# Patient Record
Sex: Female | Born: 1970 | Race: White | Hispanic: No | Marital: Married | Smoking: Current every day smoker
Health system: Southern US, Community
[De-identification: ages and names within clinical notes are randomized; demographics above are authoritative.]

---

## 1997-12-22 ENCOUNTER — Encounter: Payer: Self-pay | Admitting: Obstetrics and Gynecology

## 1997-12-22 ENCOUNTER — Ambulatory Visit (HOSPITAL_COMMUNITY): Admission: RE | Admit: 1997-12-22 | Discharge: 1997-12-22 | Payer: Self-pay | Admitting: Obstetrics and Gynecology

## 1998-06-01 ENCOUNTER — Ambulatory Visit (HOSPITAL_COMMUNITY): Admission: RE | Admit: 1998-06-01 | Discharge: 1998-06-01 | Payer: Self-pay | Admitting: Obstetrics and Gynecology

## 1999-02-12 ENCOUNTER — Ambulatory Visit (HOSPITAL_COMMUNITY): Admission: RE | Admit: 1999-02-12 | Discharge: 1999-02-12 | Payer: Self-pay | Admitting: Obstetrics and Gynecology

## 1999-02-12 ENCOUNTER — Encounter (INDEPENDENT_AMBULATORY_CARE_PROVIDER_SITE_OTHER): Payer: Self-pay

## 1999-04-03 ENCOUNTER — Emergency Department (HOSPITAL_COMMUNITY): Admission: EM | Admit: 1999-04-03 | Discharge: 1999-04-03 | Payer: Self-pay | Admitting: Emergency Medicine

## 1999-09-08 ENCOUNTER — Other Ambulatory Visit: Admission: RE | Admit: 1999-09-08 | Discharge: 1999-09-08 | Payer: Self-pay | Admitting: Obstetrics and Gynecology

## 2000-03-01 ENCOUNTER — Inpatient Hospital Stay (HOSPITAL_COMMUNITY): Admission: AD | Admit: 2000-03-01 | Discharge: 2000-03-01 | Payer: Self-pay | Admitting: Obstetrics and Gynecology

## 2000-03-30 ENCOUNTER — Inpatient Hospital Stay (HOSPITAL_COMMUNITY): Admission: AD | Admit: 2000-03-30 | Discharge: 2000-04-02 | Payer: Self-pay | Admitting: *Deleted

## 2000-04-03 ENCOUNTER — Inpatient Hospital Stay (HOSPITAL_COMMUNITY): Admission: AD | Admit: 2000-04-03 | Discharge: 2000-04-05 | Payer: Self-pay | Admitting: Obstetrics and Gynecology

## 2000-05-15 ENCOUNTER — Other Ambulatory Visit: Admission: RE | Admit: 2000-05-15 | Discharge: 2000-05-15 | Payer: Self-pay | Admitting: Obstetrics and Gynecology

## 2000-08-29 ENCOUNTER — Other Ambulatory Visit: Admission: RE | Admit: 2000-08-29 | Discharge: 2000-08-29 | Payer: Self-pay | Admitting: Obstetrics and Gynecology

## 2001-06-11 ENCOUNTER — Other Ambulatory Visit: Admission: RE | Admit: 2001-06-11 | Discharge: 2001-06-11 | Payer: Self-pay | Admitting: Obstetrics and Gynecology

## 2001-12-28 ENCOUNTER — Inpatient Hospital Stay (HOSPITAL_COMMUNITY): Admission: AD | Admit: 2001-12-28 | Discharge: 2001-12-30 | Payer: Self-pay | Admitting: Obstetrics and Gynecology

## 2002-02-17 ENCOUNTER — Other Ambulatory Visit: Admission: RE | Admit: 2002-02-17 | Discharge: 2002-02-17 | Payer: Self-pay | Admitting: Obstetrics and Gynecology

## 2003-02-16 ENCOUNTER — Other Ambulatory Visit (HOSPITAL_COMMUNITY): Admission: RE | Admit: 2003-02-16 | Discharge: 2003-02-19 | Payer: Self-pay | Admitting: Psychiatry

## 2003-05-11 ENCOUNTER — Emergency Department (HOSPITAL_COMMUNITY): Admission: EM | Admit: 2003-05-11 | Discharge: 2003-05-12 | Payer: Self-pay | Admitting: Emergency Medicine

## 2003-08-12 ENCOUNTER — Emergency Department (HOSPITAL_COMMUNITY): Admission: EM | Admit: 2003-08-12 | Discharge: 2003-08-13 | Payer: Self-pay

## 2003-08-13 ENCOUNTER — Inpatient Hospital Stay (HOSPITAL_COMMUNITY): Admission: EM | Admit: 2003-08-13 | Discharge: 2003-08-15 | Payer: Self-pay | Admitting: Psychiatry

## 2005-10-13 ENCOUNTER — Ambulatory Visit: Payer: Self-pay | Admitting: Internal Medicine

## 2006-07-18 ENCOUNTER — Ambulatory Visit: Payer: Self-pay | Admitting: Internal Medicine

## 2006-07-18 LAB — CONVERTED CEMR LAB
ALT: 18 units/L (ref 0–40)
AST: 18 units/L (ref 0–37)
Albumin: 3.8 g/dL (ref 3.5–5.2)
Alkaline Phosphatase: 56 units/L (ref 39–117)
BUN: 8 mg/dL (ref 6–23)
Basophils Absolute: 0.1 10*3/uL (ref 0.0–0.1)
Basophils Relative: 0.6 % (ref 0.0–1.0)
Bilirubin Urine: NEGATIVE
Bilirubin, Direct: 0.1 mg/dL (ref 0.0–0.3)
CO2: 31 meq/L (ref 19–32)
Calcium: 9.4 mg/dL (ref 8.4–10.5)
Chloride: 101 meq/L (ref 96–112)
Cholesterol: 173 mg/dL (ref 0–200)
Creatinine, Ser: 0.8 mg/dL (ref 0.4–1.2)
Eosinophils Absolute: 0.3 10*3/uL (ref 0.0–0.6)
Eosinophils Relative: 3 % (ref 0.0–5.0)
GFR calc Af Amer: 104 mL/min
GFR calc non Af Amer: 86 mL/min
Glucose, Bld: 79 mg/dL (ref 70–99)
HCT: 41.9 % (ref 36.0–46.0)
HDL: 52.5 mg/dL (ref >39.0)
Hemoglobin, Urine: NEGATIVE
Hemoglobin: 14.5 g/dL (ref 12.0–15.0)
Ketones, ur: NEGATIVE mg/dL
LDL Cholesterol: 106 mg/dL — ABNORMAL HIGH (ref 0–99)
Leukocytes, UA: NEGATIVE
Lymphocytes Relative: 26.7 % (ref 12.0–46.0)
MCHC: 34.5 g/dL (ref 30.0–36.0)
MCV: 95.9 fL (ref 78.0–100.0)
Monocytes Absolute: 0.6 10*3/uL (ref 0.2–0.7)
Monocytes Relative: 6.4 % (ref 3.0–11.0)
Neutro Abs: 5.3 10*3/uL (ref 1.4–7.7)
Neutrophils Relative %: 63.3 % (ref 43.0–77.0)
Nitrite: NEGATIVE
Platelets: 316 10*3/uL (ref 150–400)
Potassium: 4.6 meq/L (ref 3.5–5.1)
RBC: 4.37 M/uL (ref 3.87–5.11)
RDW: 12.2 % (ref 11.5–14.6)
Sodium: 136 meq/L (ref 135–145)
Specific Gravity, Urine: 1.01 (ref 1.000–1.03)
TSH: 2.58 microintl units/mL (ref 0.35–5.50)
Total Bilirubin: 0.7 mg/dL (ref 0.3–1.2)
Total CHOL/HDL Ratio: 3.3
Total Protein, Urine: NEGATIVE mg/dL
Total Protein: 6.7 g/dL (ref 6.0–8.3)
Triglycerides: 75 mg/dL (ref 0–149)
Urine Glucose: NEGATIVE mg/dL
Urobilinogen, UA: 0.2 (ref 0.0–1.0)
VLDL: 15 mg/dL (ref 0–40)
WBC: 8.6 10*3/uL (ref 4.5–10.5)
pH: 7 (ref 5.0–8.0)

## 2006-07-19 ENCOUNTER — Encounter: Payer: Self-pay | Admitting: Internal Medicine

## 2006-07-19 LAB — CONVERTED CEMR LAB: Valproic Acid Lvl: 70.7 ug/mL (ref 50.0–100.0)

## 2006-10-24 ENCOUNTER — Encounter: Payer: Self-pay | Admitting: Internal Medicine

## 2006-10-24 DIAGNOSIS — Z9189 Other specified personal risk factors, not elsewhere classified: Secondary | ICD-10-CM | POA: Insufficient documentation

## 2006-10-24 DIAGNOSIS — F319 Bipolar disorder, unspecified: Secondary | ICD-10-CM | POA: Insufficient documentation

## 2006-10-24 DIAGNOSIS — E039 Hypothyroidism, unspecified: Secondary | ICD-10-CM

## 2006-10-24 DIAGNOSIS — F1021 Alcohol dependence, in remission: Secondary | ICD-10-CM | POA: Insufficient documentation

## 2006-10-24 DIAGNOSIS — F1011 Alcohol abuse, in remission: Secondary | ICD-10-CM | POA: Insufficient documentation

## 2006-10-24 DIAGNOSIS — F309 Manic episode, unspecified: Secondary | ICD-10-CM | POA: Insufficient documentation

## 2006-10-24 DIAGNOSIS — F1111 Opioid abuse, in remission: Secondary | ICD-10-CM | POA: Insufficient documentation

## 2006-10-24 DIAGNOSIS — J309 Allergic rhinitis, unspecified: Secondary | ICD-10-CM | POA: Insufficient documentation

## 2007-06-15 ENCOUNTER — Emergency Department (HOSPITAL_COMMUNITY): Admission: EM | Admit: 2007-06-15 | Discharge: 2007-06-15 | Payer: Self-pay | Admitting: Emergency Medicine

## 2007-07-29 ENCOUNTER — Encounter: Payer: Self-pay | Admitting: Internal Medicine

## 2007-08-05 ENCOUNTER — Telehealth: Payer: Self-pay | Admitting: Internal Medicine

## 2007-11-14 ENCOUNTER — Ambulatory Visit: Payer: Self-pay | Admitting: Internal Medicine

## 2007-11-18 ENCOUNTER — Encounter: Payer: Self-pay | Admitting: Internal Medicine

## 2007-11-18 LAB — CONVERTED CEMR LAB
ALT: 23 units/L (ref 0–35)
AST: 19 units/L (ref 0–37)
Albumin: 4.1 g/dL (ref 3.5–5.2)
Alkaline Phosphatase: 39 units/L (ref 39–117)
BUN: 8 mg/dL (ref 6–23)
Basophils Absolute: 0 10*3/uL (ref 0.0–0.1)
Basophils Relative: 0 % (ref 0.0–3.0)
Bilirubin, Direct: 0.2 mg/dL (ref 0.0–0.3)
CO2: 29 meq/L (ref 19–32)
Calcium: 9.5 mg/dL (ref 8.4–10.5)
Chloride: 102 meq/L (ref 96–112)
Creatinine, Ser: 0.9 mg/dL (ref 0.4–1.2)
Eosinophils Absolute: 0.2 10*3/uL (ref 0.0–0.7)
Eosinophils Relative: 2.7 % (ref 0.0–5.0)
GFR calc Af Amer: 91 mL/min
GFR calc non Af Amer: 75 mL/min
Glucose, Bld: 99 mg/dL (ref 70–99)
HCT: 41.3 % (ref 36.0–46.0)
Hemoglobin: 14.4 g/dL (ref 12.0–15.0)
Lymphocytes Relative: 27.3 % (ref 12.0–46.0)
MCHC: 34.8 g/dL (ref 30.0–36.0)
MCV: 98 fL (ref 78.0–100.0)
Monocytes Absolute: 0.5 10*3/uL (ref 0.1–1.0)
Monocytes Relative: 6.4 % (ref 3.0–12.0)
Neutro Abs: 4.6 10*3/uL (ref 1.4–7.7)
Neutrophils Relative %: 63.6 % (ref 43.0–77.0)
Platelets: 250 10*3/uL (ref 150–400)
Potassium: 4.7 meq/L (ref 3.5–5.1)
RBC: 4.22 M/uL (ref 3.87–5.11)
RDW: 12.2 % (ref 11.5–14.6)
Sodium: 137 meq/L (ref 135–145)
TSH: 1.76 microintl units/mL (ref 0.35–5.50)
Total Bilirubin: 0.8 mg/dL (ref 0.3–1.2)
Total Protein: 6.7 g/dL (ref 6.0–8.3)
WBC: 7.3 10*3/uL (ref 4.5–10.5)

## 2008-01-29 ENCOUNTER — Telehealth: Payer: Self-pay | Admitting: Internal Medicine

## 2008-02-04 ENCOUNTER — Encounter: Payer: Self-pay | Admitting: Internal Medicine

## 2008-08-17 ENCOUNTER — Ambulatory Visit: Payer: Self-pay | Admitting: Internal Medicine

## 2008-08-19 LAB — CONVERTED CEMR LAB
ALT: 21 units/L (ref 0–35)
AST: 25 units/L (ref 0–37)
Albumin: 3.8 g/dL (ref 3.5–5.2)
Alkaline Phosphatase: 47 units/L (ref 39–117)
Basophils Absolute: 0 10*3/uL (ref 0.0–0.1)
Basophils Relative: 0.4 % (ref 0.0–3.0)
Bilirubin, Direct: 0.1 mg/dL (ref 0.0–0.3)
Eosinophils Absolute: 0.3 10*3/uL (ref 0.0–0.7)
Eosinophils Relative: 3.9 % (ref 0.0–5.0)
HCT: 40.6 % (ref 36.0–46.0)
Hemoglobin: 14.1 g/dL (ref 12.0–15.0)
Lymphocytes Relative: 24.4 % (ref 12.0–46.0)
Lymphs Abs: 1.9 10*3/uL (ref 0.7–4.0)
MCHC: 34.8 g/dL (ref 30.0–36.0)
MCV: 99.3 fL (ref 78.0–100.0)
Monocytes Absolute: 0.6 10*3/uL (ref 0.1–1.0)
Monocytes Relative: 7.4 % (ref 3.0–12.0)
Neutro Abs: 4.8 10*3/uL (ref 1.4–7.7)
Neutrophils Relative %: 63.9 % (ref 43.0–77.0)
Platelets: 262 10*3/uL (ref 150.0–400.0)
RBC: 4.09 M/uL (ref 3.87–5.11)
RDW: 12.3 % (ref 11.5–14.6)
Total Bilirubin: 0.7 mg/dL (ref 0.3–1.2)
Total Protein: 7.1 g/dL (ref 6.0–8.3)
Valproic Acid Lvl: 85.2 ug/mL (ref 50.0–100.0)
WBC: 7.6 10*3/uL (ref 4.5–10.5)

## 2008-11-25 ENCOUNTER — Telehealth (INDEPENDENT_AMBULATORY_CARE_PROVIDER_SITE_OTHER): Payer: Self-pay | Admitting: *Deleted

## 2009-01-12 ENCOUNTER — Ambulatory Visit: Payer: Self-pay | Admitting: Internal Medicine

## 2009-01-12 LAB — CONVERTED CEMR LAB: TSH: 1.05 microintl units/mL (ref 0.35–5.50)

## 2009-01-14 ENCOUNTER — Ambulatory Visit: Payer: Self-pay | Admitting: Internal Medicine

## 2009-01-14 DIAGNOSIS — F17200 Nicotine dependence, unspecified, uncomplicated: Secondary | ICD-10-CM

## 2009-01-14 DIAGNOSIS — Z720 Tobacco use: Secondary | ICD-10-CM | POA: Insufficient documentation

## 2010-03-24 ENCOUNTER — Ambulatory Visit
Admission: RE | Admit: 2010-03-24 | Discharge: 2010-03-24 | Payer: Self-pay | Source: Home / Self Care | Attending: Internal Medicine | Admitting: Internal Medicine

## 2010-03-25 ENCOUNTER — Ambulatory Visit: Admit: 2010-03-25 | Payer: Self-pay | Admitting: Internal Medicine

## 2010-03-30 ENCOUNTER — Other Ambulatory Visit: Payer: Self-pay

## 2010-03-31 NOTE — Assessment & Plan Note (Signed)
Summary: per flag needs appt/#/cd   Vital Signs:  Patient profile:   40 year old female Height:      65 inches Weight:      151 pounds BMI:     25.22 Temp:     98.5 degrees F oral Pulse rate:   100 / minute Pulse rhythm:   regular Resp:     16 per minute BP sitting:   110 / 74  (left arm) Cuff size:   regular  Vitals Entered By: Lanier Prude, CMA(AAMA) (March 24, 2010 10:31 AM) CC: f/u  Is Patient Diabetic? No Comments pt needs Rf on Synthroid   CC:  f/u .  History of Present Illness: F/u hypothyroidism  Current Medications (verified): 1)  Synthroid 88 Mcg  Tabs (Levothyroxine Sodium) .... Once Daily  Need Office Visit Last Ov 10/13/05 2)  Lamictal 150 Mg Tabs (Lamotrigine) .... Once Daily 3)  Depakote Er 500 Mg  Tb24 (Divalproex Sodium) .... 1000mg  4)  Trazodone Hcl 50 Mg Tabs (Trazodone Hcl) .... Take 1 1/2 By Mouth Once Daily 5)  Zyrtec Allergy 10 Mg  Tabs (Cetirizine Hcl) .... As Needed  Allergies (verified): 1)  ! Pcn 2)  ! Erythromycin 3)  ! * Opiates  Past History:  Past Medical History: Last updated: 11/14/2007 Hypothyroidism Allergic rhinitis h/o Alcoholism h/o Opioid use  Social History: Occupation: Charity fundraiser  - dialysis unit outpt Married 2 kids Current Smoker Alcohol use-no Drug use-no  Review of Systems  The patient denies anorexia, chest pain, abdominal pain, and depression.    Physical Exam  General:  Well-developed,well-nourished,in no acute distress; alert,appropriate and cooperative throughout examination Mouth:  Oral mucosa and oropharynx without lesions or exudates.  Teeth in good repair. Neck:  No deformities, masses, or tenderness noted. Lungs:  Normal respiratory effort, chest expands symmetrically. Lungs are clear to auscultation, no crackles or wheezes. Heart:  Normal rate and regular rhythm. S1 and S2 normal without gallop, murmur, click, rub or other extra sounds. Skin:  Intact without suspicious lesions or rashes Psych:   Cognition and judgment appear intact. Alert and cooperative with normal attention span and concentration. No apparent delusions, illusions, hallucinations   Impression & Recommendations:  Problem # 1:  HYPOTHYROIDISM (ICD-244.9) Assessment Unchanged  Her updated medication list for this problem includes:    Synthroid 88 Mcg Tabs (Levothyroxine sodium) ..... Once daily  need office visit last ov 10/13/05  Complete Medication List: 1)  Synthroid 88 Mcg Tabs (Levothyroxine sodium) .... Once daily  need office visit last ov 10/13/05 2)  Lamictal 150 Mg Tabs (Lamotrigine) .... Once daily 3)  Depakote Er 500 Mg Tb24 (Divalproex sodium) .... 1000mg  4)  Trazodone Hcl 50 Mg Tabs (Trazodone hcl) .... Take 1 1/2 by mouth once daily 5)  Zyrtec Allergy 10 Mg Tabs (Cetirizine hcl) .... As needed  Patient Instructions: 1)  Labs this month 2)  BMP prior to visit, ICD-9: 3)  Hepatic Panel prior to visit, ICD-9: 4)  Lipid Panel prior to visit, ICD-9: 5)  TSH prior to visit, ICD-9: 6)  CBC w/ Diff prior to visit, ICD-9: 7)  Urine-dip prior to visit, ICD-9: v70.0  244.80 8)  Please schedule a follow-up appointment in 1 year. Prescriptions: SYNTHROID 88 MCG  TABS (LEVOTHYROXINE SODIUM) once daily  NEED OFFICE VISIT LAST OV 10/13/05  #90 x 3   Entered and Authorized by:   Tresa Garter MD   Signed by:   Tresa Garter MD on 03/24/2010  Method used:   Print then Give to Patient   RxID:   [id number]    Orders Added: 1)  Est. Patient Level III [14782]

## 2010-07-15 NOTE — Discharge Summary (Signed)
NAME:  Brooke Mcbride, Brooke Mcbride                 ACCOUNT NO.:  [id number]   MEDICAL RECORD NO.:  [id number]                   PATIENT TYPE:  IPS   LOCATION:  0303                                 FACILITY:  [facility]   PHYSICIAN:  Jeanice Lim, M.D.              DATE OF BIRTH:  April 12, 1970   DATE OF ADMISSION:  08/13/2003  DATE OF DISCHARGE:  08/15/2003                                 DISCHARGE SUMMARY   IDENTIFYING DATA:  This is a 40 year old married Caucasian female  voluntarily admitted.  She presented with a history of alcohol abuse,  escalating daily use after last week, drank 1 L yesterday, hiding alcohol  from husband.  She was arguing over drinking; cut self with knife to left  arm, not sure why.  She was stressed over trying to stop smoking, aware  needed to stop alcohol use.   PAST MEDICAL HISTORY:  Medical problems: Hypothyroidism.  Primary care  physician: Georgina Quint. Plotnikov, M.D. [facility].  This is the first admission to  [facility].  She had been at [facility] one year ago.   MEDICATIONS:  1. Synthroid.  2. Cymbalta.  3. Wellbutrin.  4. Seroquel.  5. Sonata.  6. Xanax 1 mg q.i.d.  7. Neurontin 200 mg q.i.d.   DRUG ALLERGIES:  DEMEROL and ERYTHROMYCIN.   PHYSICAL EXAMINATION:  GENERAL:  Within normal limits.  NEUROLOGIC:  Nonfocal.   LABORATORY DATA:  Routine admission labs:  Essentially within normal limits.   MENTAL STATUS EXAM:  Alert, young female, cooperative, good eye contact.  Speech: Clear.  Mood: Overwhelmed, teary-eyed.  Thought process: Goal  directed, logical.  Cognitive: Intact.  Judgment and insight: Fair.   ADMISSION DIAGNOSES:   AXIS I:  1. Mood disorder, not otherwise specified.  2. Rule out bipolar disorder type II.  3. Alcohol abuse.   AXIS II:  Deferred.   AXIS III:  Hypothyroidism.   AXIS IV:  Moderate stressors related to psychosocial issues related to  alcohol and impact on behavior.   AXIS V:  30/55   HOSPITAL  COURSE:  The patient was admitted, ordered routine p.r.n.  medications, underwent further monitoring, and was encouraged to participate  in individual, group, and milieu therapy.  The patient was admitted after  self-inflicted injury and admitted for stabilization and detoxification.  Cymbalta was decreased.  Family session was ordered and coping skills worked  on as well as substance abuse treatment.  The patient reported that she had  done well until she relapsed and drank up to a bottle of wine and took Xanax  1 mg up to four times a day.  She was motivated for detoxification and  wanted to be sober.  The patient was interested in Antabuse in addition to  detoxification and stabilization of mood.  The patient reported having  regret, cut herself.  She tolerated treatment and detoxification,  participating in therapy and aftercare planning, agreeable to come off of  Klonopin and  to follow up with therapy, medication management, and AA.   CONDITION ON DISCHARGE:  Condition on discharge was markedly improved.  Mood: Euthymic.  No withdrawal symptoms, no dangerous ideation or psychotic  symptoms, given medication education.   DISCHARGE MEDICATIONS:  1. Synthroid 88 mcg daily.  2. Librium 25 mg at 6 p.m. and then one on June 19 and then stop.  3. Cymbalta 30 mg daily.  4. Wellbutrin 300 XL 300 mg q.a.m.  5. Antabuse 250 mg q.a.m.  6. ReVia 50 mg one half q.a.m.  7. Seroquel 300 mg two q.h.s.  8. Neurontin 300 mg two q.h.s.  9. Trazodone 100 mg q.h.s.   FOLLOW UP:  The patient was to follow up with Dr. Costella Hatcher on  Wednesday, June 22, at 3 p.m. and Jasmine Pang, M.D., Monday, June 20,  at 3 p.m.   DISCHARGE DIAGNOSES:   AXIS I:  1. Mood disorder, not otherwise specified.  2. Rule out bipolar disorder type II.  3. Alcohol abuse.   AXIS II:  Deferred.   AXIS III:  Hypothyroidism.   AXIS IV:  Moderate stressors related to psychosocial issues related to  alcohol and  impact on behavior.   AXIS V:  Global assessment of functioning on discharge was 55.                                               Jeanice Lim, M.D.    JEM/MEDQ  D:  09/05/2003  T:  09/07/2003  Job:  [id number]

## 2010-07-15 NOTE — Assessment & Plan Note (Signed)
[facility]                             PRIMARY CARE OFFICE NOTE   NAME:Brooke Mcbride, Brooke Mcbride              MRN:          [id number]  DATE:10/13/2005                            DOB:          Jan 05, 1971    The patient is a 40 year old female who presents to establish primary and to  address her chronic medical problems.   PAST MEDICAL HISTORY:  Over the past several years she has had problems with  alcoholism.  She names her problem as compulsive drinking.  She has been  having problems with depression later diagnosed as Bipolar.  Panic attacks.  Three suicidal attempts which lead to in-patient treatment.  Her  psychiatrist is Dr. Tomasa Rand.  She tells me that her problems started  after the birth of her second child.  She developed an addiction problem to  prescription meds.   ALLERGIES:  PENICILLIN and ERYTHROMYCIN.   SOCIAL HISTORY:  Her marriage is strong.  Kids are healthy.  She abstains  from alcohol.  She does not smoke.   FAMILY HISTORY:  Multiple family members with alcohol related problems and  with Bi-polar depression.   REVIEW OF SYSTEMS:  She has been doing well lately.  No chest pain or  shortness of breath.  No syncope.  She constantly fights an urge to drink.  She has been thinking about going back to work.   MEDICATIONS:  1. Wellbutrin XL 300 mg daily.  2. Synthroid 88 mcg daily.  3. Trazodone 300 mg at night.  4. __________ 260 mg at bedtime.  5. Lamictal 100 mg at bedtime.  6. Depakote 1000 mg at h.s.  7. Cymbalta 6 mg daily.  8. Sonata 10 mg at night.   PHYSICAL EXAMINATION:  GENERAL:  She looks well.  VITAL SIGNS:  Blood pressure 112/74.  Pulse 76.  Temperature 99.1.  Weight  148 pounds.  HEENT:  Negative.  NECK:  Supple, no thyromegaly, bruits.  LUNGS:  No wheeze or rales.  HEART:  Heart sounds are clear, no murmurs or gallops.  ABDOMEN:  Soft, nontender with no organomegaly.  EXTREMITIES:  No edema. Calves  are nontender.  PSYCH:  She is alert and cooperative.  She denies being depressed or  suicidal at the moment.   ASSESSMENT/PLAN:  1. Bipolar depression.  Severe management with multiple drugs by Dr.      Tomasa Rand.  Status post re-inpatient treatment for suicidal attempt.      She relates the onset to the birth of her second child.  2. Alcoholism.  She is on Antabuse but remains abstenant  3. History of drug abuse.  She is actually asking me to make sure we do      not give her opiates or benzodiazepines when she calls to ask for them.  4. Hyperthyroidism on therapy.  We will check TSH.  5. Allergic rhinitis given ___________ mg. and Nasacort.   I will obtain lab work including Depakote level.  I will send a copy to Dr.  Tomasa Rand.  Sonda Primes, MD   AP/MedQ  DD:  10/16/2005  DT:  10/17/2005  Job #:  [id number]   cc:   Dr. Tiajuana Amass

## 2010-07-15 NOTE — Discharge Summary (Signed)
[facility]  Patient:    Brooke Mcbride, Brooke Mcbride              MRN: [id number] Adm. Date:  [id number] Disc. Date: [id number] Attending:  Madelyn Flavors Dictator:   Danie Chandler, R.N.                           Discharge Summary  ADMITTING DIAGNOSES:          Postpartum preeclampsia.  DISCHARGE DIAGNOSES:          Postpartum preeclampsia.  PROCEDURE:                    Magnesium sulfate.  REASON FOR ADMISSION:         The patient is a 40 year old gravida 3, para 1 white female who delivered on March 31, 2000.  She was seen by home health nurse on April 03, 2000.  The patient was complaining of increased blood pressure and epigastric pain.  The patient came to the maternity admissions unit.  The patient did have a history of some increased blood pressures during pregnancy.  In the maternity admissions unit her blood pressures were 144/84, 151/95, 147/76, and 156/87, baseline blood pressure being 100/60.  Her fundus was nontender.  Patient did have some mild epigastric pain, but no right upper quadrant tenderness.  Deep tendon reflexes were 2+ and brisk without clonus. Edema was 2+ pedal edema.  SGOT 49, SGPT 44.  Urinalysis was negative for protein.  LDH 190.  Uric acid 6.0.  Hemoglobin 11.0, platelets 311,000.  Thus, the patient was admitted for magnesium sulfate due to increased liver function tests and increased blood pressures.  On hospital day #1 the patient was feeling okay.  She had less epigastric pain.  Blood pressures were 100-138/50-60s.  Urine output was 400-600 cc/hour.  Deep tendon reflexes were 1+ without clonus.  The patient still had 2+ edema.  SGOT 49, SGPT 38.  Other laboratories were within normal limits.  The magnesium sulfate was discontinued later that day.  On hospitalization day #2 the patient was without complaint.  Her vital signs were stable.  Output was excellent.  Her headache was resolved.  She was discharged  home on this day.  CONDITION ON DISCHARGE:       Good.  DIET:                         Regular, as tolerated.  FOLLOW-UP:                    In the office for her routine appointment.  She is to call for any signs or symptoms of PIH.  She is to continue her prenatal vitamins while breast-feeding. DD:  05/18/00 TD:  05/18/00 Job: [id number]

## 2010-07-15 NOTE — Op Note (Signed)
[facility]  Patient:    Brooke Mcbride               MRN: [id number] Proc. Date: 02/12/99 Adm. Date:  [id number] Attending:  Trevor Iha                           Operative Report  PREOPERATIVE DIAGNOSIS:       Intrauterine pregnancy with embryonic demise at                               approximately 10 weeks (missed abortion).  POSTOPERATIVE DIAGNOSIS:      Intrauterine pregnancy with embryonic demise at                               approximately 10 weeks (missed abortion).  OPERATION:                    D&E.  SURGEON:                      Trevor Iha, M.D.  ANESTHESIA:                   Monitored anesthetic care and paracervical block.  INDICATIONS:                  The patient is a 40 year old gravida 2, para 0, who presented to the office yesterday for routine OB visit.  Ultrasound showed an embryonic demise, no heart beat, and an 8-1/2 week size fetus.  She presents today for dilation and evacuation.  Risks and benefits were discussed at length and informed consent was obtained.  Blood type is AB positive.  DESCRIPTION OF PROCEDURE:     After adequate analgesia, the patient was placed n the dorsal lithotomy position.  She was sterilely prepped and draped.  A weighted speculum was placed and the bladder was sterilely drained.  One percent Xylocaine was injected at the anterior lip of the cervix at five and seven oclock on the cervix for paracervical block.  A speculum was placed on the anterior lip of the cervix and the uterus sounded to 9 cm and easily dilated to a #29 Hegar dilator and an 8 mm suction curet was inserted and products and conception retrieved.  This was performed until the endometrial cavity was felt to be empty followed by gentle sharp curettage revealed ________ cervix throughout the endometrial cavity.  A second pass of the suction curet was now retrieved and no more products of conception.   At this time, the suction curet was removed.  The tenaculum was then removed and the cervix was noted to be hemostatic.  The speculum was then removed. The patient was stable on transfer to the recovery room.  The estimated blood loss was 25 cc.  She received 0.2 mg of Methergine IM and 30 mg of Toradol IV.  DISPOSITION:                  The patient will be discharged home with follow up in the office in two to three weeks.  Products of conception was sent for chromosome analysis.  She is sent home with a prescription for Methergine 0.2 mg to take every eight hours for two days and doxycycline 100 mg p.o. b.i.d.  x 7 days, and furthermore for sleep, she is given Ambien 2 mg p.o. q.h.s. p.r.n. #10, for her  nerves Xanax 0.25 mg one p.o. q.8h. p.r.n. #20, and for her migraine Percocet one to two p.o. q.4h. p.r.n. #12. DD:  02/12/99 TD:  02/15/99 Job: [id number]

## 2011-04-26 ENCOUNTER — Other Ambulatory Visit: Payer: Self-pay | Admitting: Internal Medicine

## 2011-09-22 ENCOUNTER — Other Ambulatory Visit: Payer: Self-pay | Admitting: Obstetrics and Gynecology

## 2011-09-22 DIAGNOSIS — R928 Other abnormal and inconclusive findings on diagnostic imaging of breast: Secondary | ICD-10-CM

## 2011-11-13 ENCOUNTER — Ambulatory Visit
Admission: RE | Admit: 2011-11-13 | Discharge: 2011-11-13 | Disposition: A | Discharge status: Home / Self Care | Payer: 59 | Source: Ambulatory Visit | Attending: Obstetrics and Gynecology | Admitting: Obstetrics and Gynecology

## 2011-11-13 DIAGNOSIS — R928 Other abnormal and inconclusive findings on diagnostic imaging of breast: Secondary | ICD-10-CM

## 2012-04-27 LAB — HM PAP SMEAR

## 2012-09-05 ENCOUNTER — Encounter: Payer: Self-pay | Admitting: Pharmacist

## 2012-09-23 ENCOUNTER — Telehealth: Payer: Self-pay | Admitting: *Deleted

## 2012-09-23 DIAGNOSIS — Z0000 Encounter for general adult medical examination without abnormal findings: Secondary | ICD-10-CM

## 2012-09-23 NOTE — Telephone Encounter (Signed)
CPE labs entered.  

## 2012-09-23 NOTE — Telephone Encounter (Signed)
Message copied by Merrilyn Puma on Mon Sep 23, 2012  1:46 PM ------      Message from: Livingston Diones      Created: Fri Sep 20, 2012  5:06 PM       Pt has an appt for cpe 10/07/12. Please put lab work in a week prior.  ------

## 2012-10-07 ENCOUNTER — Encounter: Payer: Self-pay | Admitting: Internal Medicine

## 2012-10-07 ENCOUNTER — Ambulatory Visit (INDEPENDENT_AMBULATORY_CARE_PROVIDER_SITE_OTHER): Payer: 59 | Admitting: Internal Medicine

## 2012-10-07 VITALS — BP 140/88 | HR 72 | Temp 98.8°F | Resp 16 | Ht 65.5 in | Wt 173.0 lb

## 2012-10-07 DIAGNOSIS — E039 Hypothyroidism, unspecified: Secondary | ICD-10-CM

## 2012-10-07 DIAGNOSIS — Z0000 Encounter for general adult medical examination without abnormal findings: Secondary | ICD-10-CM

## 2012-10-07 DIAGNOSIS — F309 Manic episode, unspecified: Secondary | ICD-10-CM

## 2012-10-07 DIAGNOSIS — Z23 Encounter for immunization: Secondary | ICD-10-CM

## 2012-10-07 DIAGNOSIS — F17200 Nicotine dependence, unspecified, uncomplicated: Secondary | ICD-10-CM

## 2012-10-07 NOTE — Assessment & Plan Note (Signed)
Continue with current prescription therapy as reflected on the Med list.  

## 2012-10-07 NOTE — Assessment & Plan Note (Signed)
Discussed.

## 2012-10-07 NOTE — Addendum Note (Signed)
Addended by: Merrilyn Puma on: 10/07/2012 10:50 AM   Modules accepted: Orders

## 2012-10-07 NOTE — Progress Notes (Signed)
  Subjective:    Patient ID: Brooke Mcbride, female    DOB: 05-12-1970, 42 y.o.   MRN: [id number]  HPI  The patient is here for a wellness exam. The patient has been doing well overall without major physical or psychological issues going on lately.  Wt Readings from Last 3 Encounters:  10/07/12 173 lb (78.472 kg)  03/24/10 151 lb (68.493 kg)  01/14/09 149 lb (67.586 kg)   BP Readings from Last 3 Encounters:  10/07/12 140/88  03/24/10 110/74  01/14/09 112/84      Review of Systems  Constitutional: Positive for unexpected weight change. Negative for fever, chills, diaphoresis, activity change, appetite change and fatigue.  HENT: Negative for hearing loss, ear pain, congestion, sore throat, sneezing, mouth sores, neck pain, dental problem, voice change, postnasal drip and sinus pressure.   Eyes: Negative for pain and visual disturbance.  Respiratory: Negative for cough, chest tightness, wheezing and stridor.   Cardiovascular: Negative for chest pain, palpitations and leg swelling.  Gastrointestinal: Negative for nausea, vomiting, abdominal pain, blood in stool, abdominal distention and rectal pain.  Genitourinary: Negative for dysuria, hematuria, decreased urine volume, vaginal bleeding, vaginal discharge, difficulty urinating, vaginal pain and menstrual problem.  Musculoskeletal: Negative for back pain, joint swelling and gait problem.  Skin: Negative for color change, rash and wound.  Neurological: Negative for dizziness, tremors, syncope, speech difficulty and light-headedness.  Hematological: Negative for adenopathy.  Psychiatric/Behavioral: Negative for suicidal ideas, hallucinations, behavioral problems, confusion, sleep disturbance, dysphoric mood and decreased concentration. The patient is not nervous/anxious and is not hyperactive.        Stress        Objective:   Physical Exam  Constitutional: She appears well-developed. No distress.  Obese   HENT:  Head:  Normocephalic.  Right Ear: External ear normal.  Left Ear: External ear normal.  Nose: Nose normal.  Mouth/Throat: Oropharynx is clear and moist.  Eyes: Conjunctivae are normal. Pupils are equal, round, and reactive to light. Right eye exhibits no discharge. Left eye exhibits no discharge.  Neck: Normal range of motion. Neck supple. No JVD present. No tracheal deviation present. No thyromegaly present.  Cardiovascular: Normal rate, regular rhythm and normal heart sounds.   Pulmonary/Chest: No stridor. No respiratory distress. She has no wheezes.  Abdominal: Soft. Bowel sounds are normal. She exhibits no distension and no mass. There is no tenderness. There is no rebound and no guarding.  Musculoskeletal: She exhibits no edema and no tenderness.  Lymphadenopathy:    She has no cervical adenopathy.  Neurological: She displays normal reflexes. No cranial nerve deficit. She exhibits normal muscle tone. Coordination normal.  Skin: No rash noted. No erythema.  Psychiatric: She has a normal mood and affect. Her behavior is normal. Judgment and thought content normal.          Assessment & Plan:

## 2012-10-07 NOTE — Assessment & Plan Note (Addendum)
We discussed age appropriate health related issues, including available/recomended screening tests and vaccinations. We discussed a need for adhering to healthy diet and exercise. Labs/EKG were reviewed/ordered. All questions were answered. Lab PAP, Ophth q 12 mo tDAP

## 2012-10-09 ENCOUNTER — Telehealth: Payer: Self-pay | Admitting: *Deleted

## 2012-10-09 NOTE — Telephone Encounter (Signed)
Left a detailed message informing pt that her [facility] form is ready for p/u. Per MD she can fill in lab results when they are resulted in MyChart.

## 2012-11-05 ENCOUNTER — Ambulatory Visit (INDEPENDENT_AMBULATORY_CARE_PROVIDER_SITE_OTHER): Payer: 59

## 2012-11-05 DIAGNOSIS — Z0000 Encounter for general adult medical examination without abnormal findings: Secondary | ICD-10-CM

## 2012-11-05 LAB — HEPATIC FUNCTION PANEL
AST: 17 U/L (ref 0–37)
Albumin: 4.3 g/dL (ref 3.5–5.2)
Alkaline Phosphatase: 47 U/L (ref 39–117)
Bilirubin, Direct: 0.1 mg/dL (ref 0.0–0.3)
Total Protein: 7.2 g/dL (ref 6.0–8.3)

## 2012-11-05 LAB — CBC WITH DIFFERENTIAL/PLATELET
Basophils Relative: 0.9 % (ref 0.0–3.0)
Eosinophils Absolute: 0.4 10*3/uL (ref 0.0–0.7)
Eosinophils Relative: 3.6 % (ref 0.0–5.0)
Hemoglobin: 14.7 g/dL (ref 12.0–15.0)
Lymphocytes Relative: 33.1 % (ref 12.0–46.0)
MCHC: 34.3 g/dL (ref 30.0–36.0)
MCV: 100.1 fl — ABNORMAL HIGH (ref 78.0–100.0)
Neutro Abs: 5.8 10*3/uL (ref 1.4–7.7)
RBC: 4.28 Mil/uL (ref 3.87–5.11)
WBC: 10.3 10*3/uL (ref 4.5–10.5)

## 2012-11-05 LAB — URINALYSIS, ROUTINE W REFLEX MICROSCOPIC
Leukocytes, UA: NEGATIVE
Total Protein, Urine: NEGATIVE
Urine Glucose: NEGATIVE
pH: 6 (ref 5.0–8.0)

## 2012-11-05 LAB — BASIC METABOLIC PANEL
CO2: 29 mEq/L (ref 19–32)
Chloride: 100 mEq/L (ref 96–112)
Glucose, Bld: 85 mg/dL (ref 70–99)
Potassium: 4.5 mEq/L (ref 3.5–5.1)
Sodium: 137 mEq/L (ref 135–145)

## 2012-11-05 LAB — LIPID PANEL: VLDL: 44.4 mg/dL — ABNORMAL HIGH (ref 0.0–40.0)

## 2012-11-06 ENCOUNTER — Other Ambulatory Visit: Payer: Self-pay | Admitting: Internal Medicine

## 2012-11-06 MED ORDER — LEVOTHYROXINE SODIUM 100 MCG PO TABS: 100.0000 ug | ORAL_TABLET | Freq: Every day | ORAL | Status: DC

## 2013-10-08 ENCOUNTER — Encounter: Payer: 59 | Admitting: Internal Medicine

## 2013-11-12 ENCOUNTER — Encounter: Payer: Self-pay | Admitting: Internal Medicine

## 2013-11-12 ENCOUNTER — Ambulatory Visit (INDEPENDENT_AMBULATORY_CARE_PROVIDER_SITE_OTHER): Payer: 59 | Admitting: Internal Medicine

## 2013-11-12 VITALS — BP 118/80 | HR 80 | Temp 98.5°F | Ht 64.0 in | Wt 156.0 lb

## 2013-11-12 DIAGNOSIS — Z0000 Encounter for general adult medical examination without abnormal findings: Secondary | ICD-10-CM

## 2013-11-12 DIAGNOSIS — F1021 Alcohol dependence, in remission: Secondary | ICD-10-CM

## 2013-11-12 DIAGNOSIS — F309 Manic episode, unspecified: Secondary | ICD-10-CM

## 2013-11-12 DIAGNOSIS — R634 Abnormal weight loss: Secondary | ICD-10-CM

## 2013-11-12 DIAGNOSIS — F17200 Nicotine dependence, unspecified, uncomplicated: Secondary | ICD-10-CM

## 2013-11-12 DIAGNOSIS — E039 Hypothyroidism, unspecified: Secondary | ICD-10-CM

## 2013-11-12 DIAGNOSIS — Z9189 Other specified personal risk factors, not elsewhere classified: Secondary | ICD-10-CM

## 2013-11-12 MED ORDER — VITAMIN D 1000 UNITS PO TABS: 1000.0000 [IU] | ORAL_TABLET | Freq: Every day | ORAL | Status: AC

## 2013-11-12 MED ORDER — LEVOTHYROXINE SODIUM 100 MCG PO TABS: 100.0000 ug | ORAL_TABLET | Freq: Every day | ORAL | Status: DC

## 2013-11-12 NOTE — Assessment & Plan Note (Addendum)
We discussed age appropriate health related issues, including available/recomended screening tests and vaccinations. We discussed a need for adhering to healthy diet and exercise. Labs/EKG were reviewed/ordered. All questions were answered. Schedule GYN appt Labs Flu shot at work

## 2013-11-12 NOTE — Assessment & Plan Note (Signed)
In remission.

## 2013-11-12 NOTE — Assessment & Plan Note (Signed)
Continue with current prescription therapy as reflected on the Med list. Labs  

## 2013-11-12 NOTE — Progress Notes (Signed)
   Subjective:    HPI  The patient is here for a wellness exam. The patient has been doing well overall without major physical or psychological issues going on lately.  Wt Readings from Last 3 Encounters:  11/12/13 156 lb (70.761 kg)  10/07/12 173 lb (78.472 kg)  03/24/10 151 lb (68.493 kg)   BP Readings from Last 3 Encounters:  11/12/13 118/80  10/07/12 140/88  03/24/10 110/74      Review of Systems  Constitutional: Positive for unexpected weight change. Negative for fever, chills, diaphoresis, activity change, appetite change and fatigue.  HENT: Negative for congestion, dental problem, ear pain, hearing loss, mouth sores, postnasal drip, sinus pressure, sneezing, sore throat and voice change.   Eyes: Negative for pain and visual disturbance.  Respiratory: Negative for cough, chest tightness, wheezing and stridor.   Cardiovascular: Negative for chest pain, palpitations and leg swelling.  Gastrointestinal: Negative for nausea, vomiting, abdominal pain, blood in stool, abdominal distention and rectal pain.  Genitourinary: Negative for dysuria, hematuria, decreased urine volume, vaginal bleeding, vaginal discharge, difficulty urinating, vaginal pain and menstrual problem.  Musculoskeletal: Negative for back pain, gait problem, joint swelling and neck pain.  Skin: Negative for color change, rash and wound.  Neurological: Negative for dizziness, tremors, syncope, speech difficulty and light-headedness.  Hematological: Negative for adenopathy.  Psychiatric/Behavioral: Negative for suicidal ideas, hallucinations, behavioral problems, confusion, sleep disturbance, dysphoric mood and decreased concentration. The patient is not nervous/anxious and is not hyperactive.        Stress        Objective:   Physical Exam  Constitutional: She appears well-developed. No distress.  Obese   HENT:  Head: Normocephalic.  Right Ear: External ear normal.  Left Ear: External ear normal.  Nose:  Nose normal.  Mouth/Throat: Oropharynx is clear and moist.  Eyes: Conjunctivae are normal. Pupils are equal, round, and reactive to light. Right eye exhibits no discharge. Left eye exhibits no discharge.  Neck: Normal range of motion. Neck supple. No JVD present. No tracheal deviation present. No thyromegaly present.  Cardiovascular: Normal rate, regular rhythm and normal heart sounds.   Pulmonary/Chest: No stridor. No respiratory distress. She has no wheezes.  Abdominal: Soft. Bowel sounds are normal. She exhibits no distension and no mass. There is no tenderness. There is no rebound and no guarding.  Musculoskeletal: She exhibits no edema and no tenderness.  Lymphadenopathy:    She has no cervical adenopathy.  Neurological: She displays normal reflexes. No cranial nerve deficit. She exhibits normal muscle tone. Coordination normal.  Skin: No rash noted. No erythema.  Psychiatric: She has a normal mood and affect. Her behavior is normal. Judgment and thought content normal.     Lab Results  Component Value Date   WBC 10.3 11/05/2012   HGB 14.7 11/05/2012   HCT 42.8 11/05/2012   PLT 291.0 11/05/2012   GLUCOSE 85 11/05/2012   CHOL 221* 11/05/2012   TRIG 222.0* 11/05/2012   HDL 61.00 11/05/2012   LDLDIRECT 143.4 11/05/2012   LDLCALC 106* 07/18/2006   ALT 13 11/05/2012   AST 17 11/05/2012   NA 137 11/05/2012   K 4.5 11/05/2012   CL 100 11/05/2012   CREATININE 1.0 11/05/2012   BUN 12 11/05/2012   CO2 29 11/05/2012   TSH 5.82* 11/05/2012   INR 4.1* 09/05/2012        Assessment & Plan:

## 2013-11-12 NOTE — Assessment & Plan Note (Signed)
Eating once a day - eat more often Smoke less Labs Wt Readings from Last 3 Encounters:  11/12/13 156 lb (70.761 kg)  10/07/12 173 lb (78.472 kg)  03/24/10 151 lb (68.493 kg)

## 2013-11-12 NOTE — Assessment & Plan Note (Signed)
Continue with current prescription therapy as reflected on the Med list.  

## 2013-11-12 NOTE — Progress Notes (Signed)
Pre visit review using our clinic review tool, if applicable. No additional management support is needed unless otherwise documented below in the visit note. 

## 2013-11-12 NOTE — Assessment & Plan Note (Signed)
Discussed.

## 2013-11-12 NOTE — Assessment & Plan Note (Signed)
Prescription drugs (Percocet) - remote In remision

## 2013-11-13 ENCOUNTER — Other Ambulatory Visit: Payer: Self-pay | Admitting: Internal Medicine

## 2013-11-14 ENCOUNTER — Other Ambulatory Visit (INDEPENDENT_AMBULATORY_CARE_PROVIDER_SITE_OTHER): Payer: 59

## 2013-11-14 DIAGNOSIS — Z0000 Encounter for general adult medical examination without abnormal findings: Secondary | ICD-10-CM

## 2013-11-14 LAB — LIPID PANEL
Cholesterol: 211 mg/dL — ABNORMAL HIGH (ref 0–200)
HDL: 35.2 mg/dL — ABNORMAL LOW (ref >39.00)
LDL Cholesterol: 149 mg/dL — ABNORMAL HIGH (ref 0–99)
NonHDL: 175.8
TRIGLYCERIDES: 136 mg/dL (ref 0.0–149.0)
Total CHOL/HDL Ratio: 6
VLDL: 27.2 mg/dL (ref 0.0–40.0)

## 2013-11-14 LAB — BASIC METABOLIC PANEL
BUN: 11 mg/dL (ref 6–23)
CO2: 26 meq/L (ref 19–32)
Calcium: 9.2 mg/dL (ref 8.4–10.5)
Chloride: 102 mEq/L (ref 96–112)
Creatinine, Ser: 1.1 mg/dL (ref 0.4–1.2)
GFR: 60.53 mL/min (ref >60.00)
GLUCOSE: 94 mg/dL (ref 70–99)
Potassium: 4.2 mEq/L (ref 3.5–5.1)
Sodium: 136 mEq/L (ref 135–145)

## 2013-11-14 LAB — CBC WITH DIFFERENTIAL/PLATELET
BASOS ABS: 0.1 10*3/uL (ref 0.0–0.1)
Basophils Relative: 0.7 % (ref 0.0–3.0)
Eosinophils Absolute: 0.5 10*3/uL (ref 0.0–0.7)
Eosinophils Relative: 5 % (ref 0.0–5.0)
HCT: 44.3 % (ref 36.0–46.0)
Hemoglobin: 14.9 g/dL (ref 12.0–15.0)
LYMPHS ABS: 3.4 10*3/uL (ref 0.7–4.0)
Lymphocytes Relative: 35.8 % (ref 12.0–46.0)
MCHC: 33.6 g/dL (ref 30.0–36.0)
MCV: 97.6 fl (ref 78.0–100.0)
MONO ABS: 0.5 10*3/uL (ref 0.1–1.0)
Monocytes Relative: 5.7 % (ref 3.0–12.0)
NEUTROS ABS: 5.1 10*3/uL (ref 1.4–7.7)
Neutrophils Relative %: 52.8 % (ref 43.0–77.0)
PLATELETS: 377 10*3/uL (ref 150.0–400.0)
RBC: 4.54 Mil/uL (ref 3.87–5.11)
RDW: 13.2 % (ref 11.5–15.5)
WBC: 9.6 10*3/uL (ref 4.0–10.5)

## 2013-11-14 LAB — URINALYSIS
Bilirubin Urine: NEGATIVE
Leukocytes, UA: NEGATIVE
Nitrite: NEGATIVE
Specific Gravity, Urine: 1.015 (ref 1.000–1.030)
URINE GLUCOSE: NEGATIVE
Urobilinogen, UA: 0.2 (ref 0.0–1.0)
pH: 6.5 (ref 5.0–8.0)

## 2013-11-14 LAB — HEPATIC FUNCTION PANEL
ALT: 12 U/L (ref 0–35)
AST: 16 U/L (ref 0–37)
Albumin: 4.2 g/dL (ref 3.5–5.2)
Alkaline Phosphatase: 37 U/L — ABNORMAL LOW (ref 39–117)
BILIRUBIN TOTAL: 0.6 mg/dL (ref 0.2–1.2)
Bilirubin, Direct: 0.1 mg/dL (ref 0.0–0.3)
Total Protein: 7.3 g/dL (ref 6.0–8.3)

## 2013-11-14 LAB — TSH: TSH: 2.81 u[IU]/mL (ref 0.35–4.50)

## 2015-07-15 ENCOUNTER — Encounter: Payer: Self-pay | Admitting: Internal Medicine

## 2015-07-15 ENCOUNTER — Other Ambulatory Visit (INDEPENDENT_AMBULATORY_CARE_PROVIDER_SITE_OTHER): Payer: 59

## 2015-07-15 ENCOUNTER — Ambulatory Visit (INDEPENDENT_AMBULATORY_CARE_PROVIDER_SITE_OTHER): Payer: 59 | Admitting: Internal Medicine

## 2015-07-15 VITALS — BP 130/72 | HR 83 | Ht 65.0 in | Wt 166.0 lb

## 2015-07-15 DIAGNOSIS — E034 Atrophy of thyroid (acquired): Secondary | ICD-10-CM | POA: Diagnosis not present

## 2015-07-15 DIAGNOSIS — Z0000 Encounter for general adult medical examination without abnormal findings: Secondary | ICD-10-CM

## 2015-07-15 DIAGNOSIS — E038 Other specified hypothyroidism: Secondary | ICD-10-CM

## 2015-07-15 DIAGNOSIS — F17200 Nicotine dependence, unspecified, uncomplicated: Secondary | ICD-10-CM

## 2015-07-15 DIAGNOSIS — F1021 Alcohol dependence, in remission: Secondary | ICD-10-CM

## 2015-07-15 LAB — VITAMIN D 25 HYDROXY (VIT D DEFICIENCY, FRACTURES): VITD: 35.51 ng/mL (ref 30.00–100.00)

## 2015-07-15 LAB — CBC WITH DIFFERENTIAL/PLATELET
BASOS ABS: 0.1 10*3/uL (ref 0.0–0.1)
Basophils Relative: 0.8 % (ref 0.0–3.0)
EOS ABS: 0.4 10*3/uL (ref 0.0–0.7)
Eosinophils Relative: 3.7 % (ref 0.0–5.0)
HCT: 44.8 % (ref 36.0–46.0)
Hemoglobin: 15.4 g/dL — ABNORMAL HIGH (ref 12.0–15.0)
LYMPHS ABS: 2.9 10*3/uL (ref 0.7–4.0)
Lymphocytes Relative: 28.9 % (ref 12.0–46.0)
MCHC: 34.3 g/dL (ref 30.0–36.0)
MCV: 97.1 fl (ref 78.0–100.0)
MONO ABS: 0.6 10*3/uL (ref 0.1–1.0)
Monocytes Relative: 5.7 % (ref 3.0–12.0)
NEUTROS ABS: 6 10*3/uL (ref 1.4–7.7)
NEUTROS PCT: 60.9 % (ref 43.0–77.0)
PLATELETS: 364 10*3/uL (ref 150.0–400.0)
RBC: 4.61 Mil/uL (ref 3.87–5.11)
RDW: 13.7 % (ref 11.5–15.5)
WBC: 9.9 10*3/uL (ref 4.0–10.5)

## 2015-07-15 LAB — URINALYSIS
BILIRUBIN URINE: NEGATIVE
Hgb urine dipstick: NEGATIVE
KETONES UR: NEGATIVE
LEUKOCYTES UA: NEGATIVE
Nitrite: NEGATIVE
PH: 5.5 (ref 5.0–8.0)
Specific Gravity, Urine: 1.02 (ref 1.000–1.030)
Total Protein, Urine: NEGATIVE
UROBILINOGEN UA: 0.2 (ref 0.0–1.0)
Urine Glucose: NEGATIVE

## 2015-07-15 LAB — BASIC METABOLIC PANEL
BUN: 15 mg/dL (ref 6–23)
CHLORIDE: 105 meq/L (ref 96–112)
CO2: 27 mEq/L (ref 19–32)
CREATININE: 0.95 mg/dL (ref 0.40–1.20)
Calcium: 9.9 mg/dL (ref 8.4–10.5)
GFR: 67.43 mL/min (ref >60.00)
Glucose, Bld: 94 mg/dL (ref 70–99)
POTASSIUM: 5.4 meq/L — AB (ref 3.5–5.1)
Sodium: 137 mEq/L (ref 135–145)

## 2015-07-15 LAB — TSH: TSH: 4.85 u[IU]/mL — AB (ref 0.35–4.50)

## 2015-07-15 LAB — HEPATIC FUNCTION PANEL
ALK PHOS: 53 U/L (ref 39–117)
ALT: 25 U/L (ref 0–35)
AST: 17 U/L (ref 0–37)
Albumin: 4.7 g/dL (ref 3.5–5.2)
Bilirubin, Direct: 0.1 mg/dL (ref 0.0–0.3)
TOTAL PROTEIN: 7.3 g/dL (ref 6.0–8.3)
Total Bilirubin: 0.5 mg/dL (ref 0.2–1.2)

## 2015-07-15 LAB — T4, FREE: FREE T4: 0.62 ng/dL (ref 0.60–1.60)

## 2015-07-15 LAB — VITAMIN B12: VITAMIN B 12: 252 pg/mL (ref 211–911)

## 2015-07-15 LAB — LIPID PANEL
CHOL/HDL RATIO: 4
Cholesterol: 235 mg/dL — ABNORMAL HIGH (ref 0–200)
HDL: 65.7 mg/dL (ref >39.00)
LDL Cholesterol: 154 mg/dL — ABNORMAL HIGH (ref 0–99)
NonHDL: 169.64
TRIGLYCERIDES: 77 mg/dL (ref 0.0–149.0)
VLDL: 15.4 mg/dL (ref 0.0–40.0)

## 2015-07-15 NOTE — Assessment & Plan Note (Signed)
In remission.

## 2015-07-15 NOTE — Assessment & Plan Note (Signed)
We discussed age appropriate health related issues, including available/recomended screening tests and vaccinations. We discussed a need for adhering to healthy diet and exercise. Labs/EKG were reviewed/ordered. All questions were answered. GYN

## 2015-07-15 NOTE — Progress Notes (Signed)
Subjective:  Patient ID: Brooke Mcbride, female    DOB: 14-Jul-1970  Age: 45 y.o. MRN: [id number]  CC: Annual Exam   HPI Brooke Mcbride presents for a well exam. Pt has not been taking her Levothroid  Outpatient Prescriptions Prior to Visit  Medication Sig Dispense Refill  . lamoTRIgine (LAMICTAL) 200 MG tablet Take 200 mg by mouth daily.    . traZODone (DESYREL) 150 MG tablet Take 225 mg by mouth at bedtime.     Marland Kitchen levothyroxine (SYNTHROID, LEVOTHROID) 100 MCG tablet TAKE 1 TABLET EVERY DAY 90 tablet 2  . divalproex (DEPAKOTE) 500 MG DR tablet Take 500 mg by mouth 2 (two) times daily. Reported on 07/15/2015    . levothyroxine (SYNTHROID, LEVOTHROID) 100 MCG tablet Take 1 tablet (100 mcg total) by mouth daily. (Patient not taking: Reported on 07/15/2015) 90 tablet 3   No facility-administered medications prior to visit.    ROS Review of Systems  Constitutional: Negative for chills, activity change, appetite change, fatigue and unexpected weight change.  HENT: Negative for congestion, mouth sores and sinus pressure.   Eyes: Negative for visual disturbance.  Respiratory: Negative for cough and chest tightness.   Gastrointestinal: Negative for nausea and abdominal pain.  Genitourinary: Negative for frequency, difficulty urinating and vaginal pain.  Musculoskeletal: Negative for back pain and gait problem.  Skin: Negative for pallor and rash.  Neurological: Negative for dizziness, tremors, weakness, numbness and headaches.  Psychiatric/Behavioral: Negative for confusion and sleep disturbance.    Objective:  BP 130/72 mmHg  Pulse 83  Ht 5\' 5"  (1.651 m)  Wt 166 lb (75.297 kg)  BMI 27.62 kg/m2  SpO2 97%  BP Readings from Last 3 Encounters:  07/15/15 130/72  11/12/13 118/80  10/07/12 140/88    Wt Readings from Last 3 Encounters:  07/15/15 166 lb (75.297 kg)  11/12/13 156 lb (70.761 kg)  10/07/12 173 lb (78.472 kg)    Physical Exam  Constitutional: She  appears well-developed. No distress.  HENT:  Head: Normocephalic.  Right Ear: External ear normal.  Left Ear: External ear normal.  Nose: Nose normal.  Mouth/Throat: Oropharynx is clear and moist.  Eyes: Conjunctivae are normal. Pupils are equal, round, and reactive to light. Right eye exhibits no discharge. Left eye exhibits no discharge.  Neck: Normal range of motion. Neck supple. No JVD present. No tracheal deviation present. No thyromegaly present.  Cardiovascular: Normal rate, regular rhythm and normal heart sounds.   Pulmonary/Chest: No stridor. No respiratory distress. She has no wheezes.  Abdominal: Soft. Bowel sounds are normal. She exhibits no distension and no mass. There is no tenderness. There is no rebound and no guarding.  Musculoskeletal: She exhibits no edema or tenderness.  Lymphadenopathy:    She has no cervical adenopathy.  Neurological: She displays normal reflexes. No cranial nerve deficit. She exhibits normal muscle tone. Coordination normal.  Skin: No rash noted. No erythema.  Psychiatric: She has a normal mood and affect. Her behavior is normal. Judgment and thought content normal.    Lab Results  Component Value Date   WBC 9.6 11/14/2013   HGB 14.9 11/14/2013   HCT 44.3 11/14/2013   PLT 377.0 11/14/2013   GLUCOSE 94 11/14/2013   CHOL 211* 11/14/2013   TRIG 136.0 11/14/2013   HDL 35.20* 11/14/2013   LDLDIRECT 143.4 11/05/2012   LDLCALC 149* 11/14/2013   ALT 12 11/14/2013   AST 16 11/14/2013   NA 136 11/14/2013   K 4.2 11/14/2013   CL  102 11/14/2013   CREATININE 1.1 11/14/2013   BUN 11 11/14/2013   CO2 26 11/14/2013   TSH 2.81 11/14/2013   INR 4.1* 09/05/2012    Mm Digital Diagnostic Bilat  11/13/2011  *RADIOLOGY REPORT* Clinical Data:  Further evaluation of bilateral asymmetries DIGITAL DIAGNOSTIC BILATERAL MAMMOGRAM Comparison:  09/19/2011 Findings:  No persistent abnormality on either side with spot compression views IMPRESSION: Negative BI-RADS  CATEGORY 1:  Negative. RECOMMENDATION: Return to annual screening mammography Original Report Authenticated By: Brooke Mcbride, M.D.    Assessment & Plan:   There are no diagnoses linked to this encounter. I am having Ms. Brooke Mcbride maintain her traZODone, divalproex, lamoTRIgine, and levothyroxine.  No orders of the defined types were placed in this encounter.     Follow-up: No Follow-up on file.  Walker Kehr, MD

## 2015-07-15 NOTE — Assessment & Plan Note (Signed)
Off meds now Labs

## 2015-07-15 NOTE — Assessment & Plan Note (Signed)
1 ppd Discussed

## 2015-07-15 NOTE — Progress Notes (Signed)
Pre visit review using our clinic review tool, if applicable. No additional management support is needed unless otherwise documented below in the visit note. 

## 2015-07-18 NOTE — Addendum Note (Signed)
Addended by: Cassandria Anger on: 07/18/2015 01:48 PM   Modules accepted: Orders, SmartSet

## 2015-08-09 ENCOUNTER — Telehealth: Payer: Self-pay | Admitting: Internal Medicine

## 2015-08-09 NOTE — Telephone Encounter (Signed)
States dropped off wellness form to provider during last OV and has not heard back on the status.

## 2015-08-09 NOTE — Telephone Encounter (Signed)
I don't have it Thx 

## 2015-08-11 NOTE — Telephone Encounter (Signed)
I called pt- informed of below. I asked her to try to get [place] another copy. She states she will. Meanwhile, I will check PCP's office.

## 2015-09-02 NOTE — Telephone Encounter (Signed)
Received email with new paperwork. Sending to dahlia for completion today

## 2015-09-02 NOTE — Telephone Encounter (Signed)
Paperwork completed and forwarded to Dr [employer] assistant for signature upon his return 09/06/2015

## 2015-09-14 NOTE — Telephone Encounter (Signed)
Dahlia please follow up with dr plotnikov. i continue to receive emails from this patient requesting a status

## 2015-09-14 NOTE — Telephone Encounter (Signed)
Paperwork faxed, pt advised of same

## 2015-09-14 NOTE — Telephone Encounter (Signed)
Erline Levine, have you received this paperwork back from [facility]?

## 2015-12-03 ENCOUNTER — Encounter: Payer: Self-pay | Admitting: Podiatry

## 2015-12-03 ENCOUNTER — Ambulatory Visit: Payer: Self-pay

## 2015-12-03 ENCOUNTER — Ambulatory Visit (INDEPENDENT_AMBULATORY_CARE_PROVIDER_SITE_OTHER): Payer: 59

## 2015-12-03 ENCOUNTER — Ambulatory Visit (INDEPENDENT_AMBULATORY_CARE_PROVIDER_SITE_OTHER): Payer: 59 | Admitting: Podiatry

## 2015-12-03 VITALS — BP 127/77 | HR 76 | Resp 16 | Ht 65.0 in | Wt 145.0 lb

## 2015-12-03 DIAGNOSIS — M79672 Pain in left foot: Secondary | ICD-10-CM

## 2015-12-03 DIAGNOSIS — S92301A Fracture of unspecified metatarsal bone(s), right foot, initial encounter for closed fracture: Secondary | ICD-10-CM

## 2015-12-03 DIAGNOSIS — M79671 Pain in right foot: Secondary | ICD-10-CM

## 2015-12-03 NOTE — Progress Notes (Signed)
Subjective:     Patient ID: Brooke Mcbride, female   DOB: [birth year], 45 y.o.   MRN: [id number]  HPI patient presents stating she's getting a lot of pain under her right foot for the last 2 weeks and she woke up all of a sudden and it was severely tender   Review of Systems  All other systems reviewed and are negative.      Objective:   Physical Exam  Constitutional: She is oriented to person, place, and time.  Cardiovascular: Intact distal pulses.   Musculoskeletal: Normal range of motion.  Neurological: She is oriented to person, place, and time.  Skin: Skin is warm.  Nursing note and vitals reviewed.  neurovascular status intact muscle strength adequate range of motion within normal limits with patient noted to have exquisite discomfort around the tibial sesamoid right with inflammation and fluid buildup. It is very tender when pressed and hard for her to walk on     Assessment:     Strong possibility for tibial sesamoid fracture versus inflammatory condition or systemic issue or hallux limitus condition    Plan:     H&P x-ray reviewed condition discussed and at this point recommended cast immobilization with dancer's pad and patient will be seen back again in 5 weeks. Patient will be seen to reevaluate and understands ultimately it may require sesamoid removal if healing does not occur  X-ray report indicates there is a crack in the sesamoid right with none noted left but it's difficult to tell whether it bipart Hall Busing or strictly related to trauma fracture

## 2015-12-03 NOTE — Progress Notes (Signed)
   Subjective:    Patient ID: Brooke Mcbride, female    DOB: 02/11/1971, 45 y.o.   MRN: [id number]  HPI Chief Complaint  Patient presents with  . Foot Pain    Right foot; great toe-joint; pt stated, "Pain came on suddenly 2 weeks ago"      Review of Systems  All other systems reviewed and are negative.      Objective:   Physical Exam        Assessment & Plan:

## 2015-12-10 ENCOUNTER — Ambulatory Visit: Payer: 59 | Admitting: Podiatry

## 2016-01-07 ENCOUNTER — Encounter: Payer: Self-pay | Admitting: Podiatry

## 2016-01-07 ENCOUNTER — Ambulatory Visit (INDEPENDENT_AMBULATORY_CARE_PROVIDER_SITE_OTHER): Payer: 59 | Admitting: Podiatry

## 2016-01-07 ENCOUNTER — Ambulatory Visit (INDEPENDENT_AMBULATORY_CARE_PROVIDER_SITE_OTHER): Payer: 59

## 2016-01-07 VITALS — BP 126/77 | HR 83 | Resp 16

## 2016-01-07 DIAGNOSIS — M79671 Pain in right foot: Secondary | ICD-10-CM | POA: Diagnosis not present

## 2016-01-07 DIAGNOSIS — S92301A Fracture of unspecified metatarsal bone(s), right foot, initial encounter for closed fracture: Secondary | ICD-10-CM

## 2016-01-08 NOTE — Progress Notes (Signed)
Subjective:     Patient ID: Brooke Mcbride, female   DOB: [birth year], 45 y.o.   MRN: [id number]  HPI patient states my right foot seems improved and the boot really helped me   Review of Systems     Objective:   Physical Exam Neurovascular status intact with significant diminishment of discomfort of the sesamoidal complex right with inflammation noted and has just started to wear shoe in the last several days with no acute swelling noted    Assessment:      Doing well post possibility for sesamoidal fracture right with no acute symptoms noted    Plan:     Instructed on importance of gradual reduction in the boot over the next several weeks and if symptoms were to reoccur we'll have to consider more strongly that there may be a fracture this will complex. Patient will be seen back for [place] to recheck

## 2016-07-07 ENCOUNTER — Encounter: Payer: Self-pay | Admitting: Internal Medicine

## 2016-07-07 ENCOUNTER — Ambulatory Visit (INDEPENDENT_AMBULATORY_CARE_PROVIDER_SITE_OTHER): Payer: 59 | Admitting: Internal Medicine

## 2016-07-07 DIAGNOSIS — S61451A Open bite of right hand, initial encounter: Secondary | ICD-10-CM | POA: Diagnosis not present

## 2016-07-07 DIAGNOSIS — W5501XA Bitten by cat, initial encounter: Secondary | ICD-10-CM

## 2016-07-07 MED ORDER — LEVOFLOXACIN 750 MG PO TABS: 750.0000 mg | ORAL_TABLET | Freq: Every day | ORAL | 0 refills | Status: DC

## 2016-07-07 NOTE — Assessment & Plan Note (Signed)
Levaquin

## 2016-07-07 NOTE — Progress Notes (Signed)
Subjective:  Patient ID: Brooke Mcbride, female    DOB: [birth year]  Age: 46 y.o. MRN: [id number]  CC: No chief complaint on file.   HPI Brooke Mcbride presents for R hand cat bite (fiend's in-house cat, vaccinated)yesterday - swelling and redness is getting worse  Outpatient Medications Prior to Visit  Medication Sig Dispense Refill  . lamoTRIgine (LAMICTAL) 200 MG tablet Take 200 mg by mouth daily.    Marland Kitchen levothyroxine (SYNTHROID, LEVOTHROID) 100 MCG tablet Take 1 tablet (100 mcg total) by mouth daily. 90 tablet 3  . traZODone (DESYREL) 150 MG tablet Take 225 mg by mouth at bedtime.      No facility-administered medications prior to visit.     ROS Review of Systems  Constitutional: Negative.  Negative for chills, diaphoresis and fever.  HENT: Negative for drooling.   Eyes: Negative for visual disturbance.  Gastrointestinal: Negative for rectal pain.  Musculoskeletal: Negative for arthralgias.  Skin: Positive for color change and wound. Negative for rash.    Objective:  BP 126/72 (BP Location: Left Arm, Patient Position: Sitting, Cuff Size: Normal)   Pulse 81   Temp 98.6 F (37 C) (Oral)   Ht 5\' 5"  (1.651 m)   Wt 146 lb (66.2 kg)   SpO2 99%   BMI 24.30 kg/m   BP Readings from Last 3 Encounters:  07/07/16 126/72  01/07/16 126/77  12/03/15 127/77    Wt Readings from Last 3 Encounters:  07/07/16 146 lb (66.2 kg)  12/03/15 145 lb (65.8 kg)  07/15/15 166 lb (75.3 kg)    Physical Exam  Constitutional: She appears well-developed. No distress.  HENT:  Head: Normocephalic.  Right Ear: External ear normal.  Left Ear: External ear normal.  Nose: Nose normal.  Mouth/Throat: Oropharynx is clear and moist.  Eyes: Conjunctivae are normal. Pupils are equal, round, and reactive to light. Right eye exhibits no discharge. Left eye exhibits no discharge.  Neck: Normal range of motion. Neck supple. No JVD present. No tracheal deviation present. No thyromegaly  present.  Cardiovascular: Normal rate, regular rhythm and normal heart sounds.   Pulmonary/Chest: No stridor. No respiratory distress. She has no wheezes.  Abdominal: Soft. Bowel sounds are normal. She exhibits no distension and no mass. There is no tenderness. There is no rebound and no guarding.  Musculoskeletal: She exhibits no edema or tenderness.  Lymphadenopathy:    She has no cervical adenopathy.  Neurological: She displays normal reflexes. No cranial nerve deficit. She exhibits normal muscle tone. Coordination normal.  Skin: No rash noted. There is erythema.  Psychiatric: She has a normal mood and affect. Her behavior is normal. Judgment and thought content normal.  R ulnar palm w/erythema, swelling, pain and a puncture wound  Lab Results  Component Value Date   WBC 9.9 07/15/2015   HGB 15.4 (H) 07/15/2015   HCT 44.8 07/15/2015   PLT 364.0 07/15/2015   GLUCOSE 94 07/15/2015   CHOL 235 (H) 07/15/2015   TRIG 77.0 07/15/2015   HDL 65.70 07/15/2015   LDLDIRECT 143.4 11/05/2012   LDLCALC 154 (H) 07/15/2015   ALT 25 07/15/2015   AST 17 07/15/2015   NA 137 07/15/2015   K 5.4 (H) 07/15/2015   CL 105 07/15/2015   CREATININE 0.95 07/15/2015   BUN 15 07/15/2015   CO2 27 07/15/2015   TSH 4.85 (H) 07/15/2015   INR 4.1 (A) 09/05/2012    Mm Digital Diagnostic Bilat  Result Date: 11/13/2011 *RADIOLOGY REPORT* Clinical Data:  Further  evaluation of bilateral asymmetries DIGITAL DIAGNOSTIC BILATERAL MAMMOGRAM Comparison:  09/19/2011 Findings:  No persistent abnormality on either side with spot compression views IMPRESSION: Negative BI-RADS CATEGORY 1:  Negative. RECOMMENDATION: Return to annual screening mammography Original Report Authenticated By: Rachael Fee, M.D.    Assessment & Plan:   There are no diagnoses linked to this encounter. I am having Brooke Mcbride maintain her traZODone, lamoTRIgine, and levothyroxine.  No orders of the defined types were placed in this  encounter.    Follow-up: No Follow-up on file.  Walker Kehr, MD

## 2016-07-19 ENCOUNTER — Other Ambulatory Visit: Payer: Self-pay | Admitting: Internal Medicine

## 2016-07-19 ENCOUNTER — Other Ambulatory Visit (INDEPENDENT_AMBULATORY_CARE_PROVIDER_SITE_OTHER): Payer: 59

## 2016-07-19 ENCOUNTER — Ambulatory Visit (INDEPENDENT_AMBULATORY_CARE_PROVIDER_SITE_OTHER): Payer: 59 | Admitting: Internal Medicine

## 2016-07-19 ENCOUNTER — Encounter: Payer: Self-pay | Admitting: Internal Medicine

## 2016-07-19 VITALS — BP 114/76 | HR 72 | Temp 98.0°F | Ht 65.0 in | Wt 141.0 lb

## 2016-07-19 DIAGNOSIS — F32A Depression, unspecified: Secondary | ICD-10-CM | POA: Insufficient documentation

## 2016-07-19 DIAGNOSIS — L237 Allergic contact dermatitis due to plants, except food: Secondary | ICD-10-CM | POA: Diagnosis not present

## 2016-07-19 DIAGNOSIS — D485 Neoplasm of uncertain behavior of skin: Secondary | ICD-10-CM | POA: Diagnosis not present

## 2016-07-19 DIAGNOSIS — Z0000 Encounter for general adult medical examination without abnormal findings: Secondary | ICD-10-CM

## 2016-07-19 DIAGNOSIS — E038 Other specified hypothyroidism: Secondary | ICD-10-CM

## 2016-07-19 DIAGNOSIS — F329 Major depressive disorder, single episode, unspecified: Secondary | ICD-10-CM | POA: Insufficient documentation

## 2016-07-19 DIAGNOSIS — F3289 Other specified depressive episodes: Secondary | ICD-10-CM

## 2016-07-19 DIAGNOSIS — E063 Autoimmune thyroiditis: Principal | ICD-10-CM

## 2016-07-19 DIAGNOSIS — E034 Atrophy of thyroid (acquired): Secondary | ICD-10-CM | POA: Diagnosis not present

## 2016-07-19 DIAGNOSIS — L259 Unspecified contact dermatitis, unspecified cause: Secondary | ICD-10-CM | POA: Insufficient documentation

## 2016-07-19 LAB — URINALYSIS
BILIRUBIN URINE: NEGATIVE
Hgb urine dipstick: NEGATIVE
Ketones, ur: NEGATIVE
Leukocytes, UA: NEGATIVE
Nitrite: NEGATIVE
PH: 6 (ref 5.0–8.0)
SPECIFIC GRAVITY, URINE: 1.01 (ref 1.000–1.030)
Total Protein, Urine: NEGATIVE
URINE GLUCOSE: NEGATIVE
Urobilinogen, UA: 0.2 (ref 0.0–1.0)

## 2016-07-19 LAB — CBC WITH DIFFERENTIAL/PLATELET
Basophils Absolute: 0.1 10*3/uL (ref 0.0–0.1)
Basophils Relative: 1 % (ref 0.0–3.0)
EOS PCT: 3.8 % (ref 0.0–5.0)
Eosinophils Absolute: 0.3 10*3/uL (ref 0.0–0.7)
HEMATOCRIT: 44 % (ref 36.0–46.0)
Hemoglobin: 15 g/dL (ref 12.0–15.0)
LYMPHS ABS: 2.1 10*3/uL (ref 0.7–4.0)
LYMPHS PCT: 27.8 % (ref 12.0–46.0)
MCHC: 34.2 g/dL (ref 30.0–36.0)
MCV: 98.3 fl (ref 78.0–100.0)
MONOS PCT: 7.4 % (ref 3.0–12.0)
Monocytes Absolute: 0.6 10*3/uL (ref 0.1–1.0)
NEUTROS ABS: 4.6 10*3/uL (ref 1.4–7.7)
NEUTROS PCT: 60 % (ref 43.0–77.0)
PLATELETS: 315 10*3/uL (ref 150.0–400.0)
RBC: 4.47 Mil/uL (ref 3.87–5.11)
RDW: 13.3 % (ref 11.5–15.5)
WBC: 7.6 10*3/uL (ref 4.0–10.5)

## 2016-07-19 LAB — BASIC METABOLIC PANEL
BUN: 13 mg/dL (ref 6–23)
CALCIUM: 10 mg/dL (ref 8.4–10.5)
CO2: 27 meq/L (ref 19–32)
Chloride: 102 mEq/L (ref 96–112)
Creatinine, Ser: 1.07 mg/dL (ref 0.40–1.20)
GFR: 58.52 mL/min — ABNORMAL LOW (ref >60.00)
GLUCOSE: 89 mg/dL (ref 70–99)
POTASSIUM: 4.7 meq/L (ref 3.5–5.1)
Sodium: 137 mEq/L (ref 135–145)

## 2016-07-19 LAB — LIPID PANEL
CHOLESTEROL: 211 mg/dL — AB (ref 0–200)
HDL: 70.1 mg/dL (ref >39.00)
LDL Cholesterol: 124 mg/dL — ABNORMAL HIGH (ref 0–99)
NonHDL: 140.66
Total CHOL/HDL Ratio: 3
Triglycerides: 83 mg/dL (ref 0.0–149.0)
VLDL: 16.6 mg/dL (ref 0.0–40.0)

## 2016-07-19 LAB — HEPATIC FUNCTION PANEL
ALBUMIN: 4.6 g/dL (ref 3.5–5.2)
ALK PHOS: 47 U/L (ref 39–117)
ALT: 18 U/L (ref 0–35)
AST: 17 U/L (ref 0–37)
Bilirubin, Direct: 0.1 mg/dL (ref 0.0–0.3)
TOTAL PROTEIN: 7.1 g/dL (ref 6.0–8.3)
Total Bilirubin: 0.6 mg/dL (ref 0.2–1.2)

## 2016-07-19 LAB — TSH: TSH: 4.5 u[IU]/mL (ref 0.35–4.50)

## 2016-07-19 MED ORDER — TRIAMCINOLONE ACETONIDE 0.1 % EX CREA: 1.0000 "application " | TOPICAL_CREAM | Freq: Two times a day (BID) | CUTANEOUS | 1 refills | Status: DC

## 2016-07-19 NOTE — Assessment & Plan Note (Signed)
Not taking Levothroid now x 1 year for ?reason

## 2016-07-19 NOTE — Assessment & Plan Note (Signed)
Skin bx w/me in 6 wks if not resolved kenalog

## 2016-07-19 NOTE — Patient Instructions (Signed)
Skin bx w/me in 6 wks

## 2016-07-19 NOTE — Assessment & Plan Note (Signed)
Chronic - poss Hashimoto's  Not taking Levothroid now for ?reason x 1 year - TSH - WNL Repeat TSH/FT4 in 6 mo. OK to stay off Levothyroid

## 2016-07-19 NOTE — Progress Notes (Signed)
Subjective:  Patient ID: Brooke Mcbride, female    DOB: February 24, 1971  Age: 46 y.o. MRN: [id number]  CC: No chief complaint on file.   HPI [place] A Kunkle presents for a well exam C/o a skin rash on the L shoulder F/u cat bite Pt goes to [facility]  Outpatient Medications Prior to Visit  Medication Sig Dispense Refill  . lamoTRIgine (LAMICTAL) 200 MG tablet Take 200 mg by mouth daily.    Marland Kitchen levofloxacin (LEVAQUIN) 750 MG tablet Take 1 tablet (750 mg total) by mouth daily. 10 tablet 0  . traZODone (DESYREL) 150 MG tablet Take 225 mg by mouth at bedtime.     Marland Kitchen levothyroxine (SYNTHROID, LEVOTHROID) 100 MCG tablet Take 1 tablet (100 mcg total) by mouth daily. (Patient not taking: Reported on 07/19/2016) 90 tablet 3   No facility-administered medications prior to visit.     ROS Review of Systems  Constitutional: Negative for activity change, appetite change, chills, fatigue and unexpected weight change.  HENT: Negative for congestion, mouth sores and sinus pressure.   Eyes: Negative for visual disturbance.  Respiratory: Negative for cough and chest tightness.   Gastrointestinal: Negative for abdominal pain and nausea.  Genitourinary: Negative for difficulty urinating, frequency and vaginal pain.  Musculoskeletal: Negative for back pain and gait problem.  Skin: Positive for rash. Negative for pallor.  Neurological: Negative for dizziness, tremors, weakness, numbness and headaches.  Psychiatric/Behavioral: Negative for confusion, decreased concentration, sleep disturbance and suicidal ideas. The patient is not nervous/anxious.     Objective:  BP 114/76 (BP Location: Left Arm, Patient Position: Sitting, Cuff Size: Normal)   Pulse 72   Temp 98 F (36.7 C) (Oral)   Ht 5\' 5"  (1.651 m)   Wt 141 lb (64 kg)   SpO2 99%   BMI 23.46 kg/m   BP Readings from Last 3 Encounters:  07/19/16 114/76  07/07/16 126/72  01/07/16 126/77    Wt Readings from Last 3  Encounters:  07/19/16 141 lb (64 kg)  07/07/16 146 lb (66.2 kg)  12/03/15 145 lb (65.8 kg)    Physical Exam  Constitutional: She appears well-developed. No distress.  HENT:  Head: Normocephalic.  Right Ear: External ear normal.  Left Ear: External ear normal.  Nose: Nose normal.  Mouth/Throat: Oropharynx is clear and moist.  Eyes: Conjunctivae are normal. Pupils are equal, round, and reactive to light. Right eye exhibits no discharge. Left eye exhibits no discharge.  Neck: Normal range of motion. Neck supple. No JVD present. No tracheal deviation present. No thyromegaly present.  Cardiovascular: Normal rate, regular rhythm and normal heart sounds.   Pulmonary/Chest: No stridor. No respiratory distress. She has no wheezes.  Abdominal: Soft. Bowel sounds are normal. She exhibits no distension and no mass. There is no tenderness. There is no rebound and no guarding.  Musculoskeletal: She exhibits no edema or tenderness.  Lymphadenopathy:    She has no cervical adenopathy.  Neurological: She displays normal reflexes. No cranial nerve deficit. She exhibits normal muscle tone. Coordination normal.  Skin: No rash noted. No erythema.  Psychiatric: She has a normal mood and affect. Her behavior is normal. Judgment and thought content normal.  11x8 mm L shoulder eryth papule under the bra strap - irritated Extensive poison ivy rash on B arms  Lab Results  Component Value Date   WBC 9.9 07/15/2015   HGB 15.4 (H) 07/15/2015   HCT 44.8 07/15/2015   PLT 364.0 07/15/2015   GLUCOSE 94 07/15/2015  CHOL 235 (H) 07/15/2015   TRIG 77.0 07/15/2015   HDL 65.70 07/15/2015   LDLDIRECT 143.4 11/05/2012   LDLCALC 154 (H) 07/15/2015   ALT 25 07/15/2015   AST 17 07/15/2015   NA 137 07/15/2015   K 5.4 (H) 07/15/2015   CL 105 07/15/2015   CREATININE 0.95 07/15/2015   BUN 15 07/15/2015   CO2 27 07/15/2015   TSH 4.85 (H) 07/15/2015   INR 4.1 (A) 09/05/2012    Mm Digital Diagnostic  Bilat  Result Date: 11/13/2011 *RADIOLOGY REPORT* Clinical Data:  Further evaluation of bilateral asymmetries DIGITAL DIAGNOSTIC BILATERAL MAMMOGRAM Comparison:  09/19/2011 Findings:  No persistent abnormality on either side with spot compression views IMPRESSION: Negative BI-RADS CATEGORY 1:  Negative. RECOMMENDATION: Return to annual screening mammography Original Report Authenticated By: Rachael Fee, M.D.    Assessment & Plan:   There are no diagnoses linked to this encounter. I am having Ms. Penafiel maintain her traZODone, lamoTRIgine, levothyroxine, and levofloxacin.  No orders of the defined types were placed in this encounter.    Follow-up: No Follow-up on file.  Walker Kehr, MD

## 2016-07-19 NOTE — Assessment & Plan Note (Signed)
Pt goes to [facility]

## 2016-07-19 NOTE — Assessment & Plan Note (Signed)
Kenalog cream 

## 2016-07-19 NOTE — Assessment & Plan Note (Addendum)
We discussed age appropriate health related issues, including available/recomended screening tests and vaccinations. We discussed a need for adhering to healthy diet and exercise. Labs were ordered to be later reviewed . All questions were answered. Well w/gyn

## 2016-10-13 ENCOUNTER — Ambulatory Visit (INDEPENDENT_AMBULATORY_CARE_PROVIDER_SITE_OTHER): Payer: 59 | Admitting: Family Medicine

## 2016-10-13 ENCOUNTER — Encounter: Payer: Self-pay | Admitting: Family Medicine

## 2016-10-13 VITALS — BP 118/86 | HR 75 | Temp 98.0°F | Wt 147.0 lb

## 2016-10-13 DIAGNOSIS — R21 Rash and other nonspecific skin eruption: Secondary | ICD-10-CM | POA: Diagnosis not present

## 2016-10-13 MED ORDER — CLINDAMYCIN HCL 150 MG PO CAPS: 150.0000 mg | ORAL_CAPSULE | Freq: Three times a day (TID) | ORAL | 0 refills | Status: DC

## 2016-10-13 NOTE — Assessment & Plan Note (Signed)
It almost appears to be an infected hair but is larger. Does not appear to be an abscess formation with no area of fluctuance. There was spontaneous draining upon exam today that was bloody discharge. Does not appear to be genital sores. There is no streaking. - Initiate clindamycin - Advised supportive care - If no improvement advised to follow up. May need to try to ultrasound this to evaluate for any abscess formation.

## 2016-10-13 NOTE — Progress Notes (Signed)
  Brooke Mcbride - 46 y.o. female MRN [id number]  Date of birth: 12-29-1970  SUBJECTIVE:  Including CC & ROS.  Chief Complaint  Patient presents with  . Groin Swelling    bump on right side groin area, started off flat and mushy, now is harder and more sore---   Brooke Mcbride is a 46 year old female that is presenting with a lump in her groin. She reports this started about 3 days ago. She's had similar instances but has never been this big. She denies any fevers or chills. It is painful to the touch. She is not tried any medications for this. It is occurring in the pain panty line of her underwear. She denies any shaving in this area. She has not had any trauma in this area.     Review of Systems  Constitutional: Negative for fever.  Genitourinary: Negative for genital sores.  Musculoskeletal: Negative for joint swelling.  Skin: Positive for color change.  Hematological: Negative for adenopathy.   otherwise negative  HISTORY: Past Medical, Surgical, Social, and Family History Reviewed & Updated per EMR.   Pertinent Historical Findings include:  No past medical history on file.  No past surgical history on file.  Allergies  Allergen Reactions  . Erythromycin Hives  . Penicillins Hives    No family history on file.   Social History   Social History  . Marital status: Married    Spouse name: N/A  . Number of children: N/A  . Years of education: N/A   Occupational History  . Not on file.   Social History Main Topics  . Smoking status: Current Every Day Smoker  . Smokeless tobacco: Never Used  . Alcohol use Not on file  . Drug use: Unknown  . Sexual activity: Yes   Other Topics Concern  . Not on file   Social History Narrative  . No narrative on file     PHYSICAL EXAM:  VS: BP 118/86   Pulse 75   Temp 98 F (36.7 C)   Wt 147 lb (66.7 kg)   SpO2 98%   BMI 24.46 kg/m  Physical Exam Gen: NAD, alert, cooperative with exam, well-appearing ENT:  normal lips, normal nasal mucosa,  Eye: normal EOM, normal conjunctiva and lids CV:  no edema, +2 pedal pulses   Resp: no accessory muscle use, non-labored,  Skin: Area of induration and redness in the right groin near the origin of the abductor muscles. Some draining present. No area of fluctuance. No streaking apparent. Neuro: normal tone, normal sensation to touch Psych:  normal insight, alert and oriented MSK: Normal strength, normal gait      ASSESSMENT & PLAN:   Rash It almost appears to be an infected hair but is larger. Does not appear to be an abscess formation with no area of fluctuance. There was spontaneous draining upon exam today that was bloody discharge. Does not appear to be genital sores. There is no streaking. - Initiate clindamycin - Advised supportive care - If no improvement advised to follow up. May need to try to ultrasound this to evaluate for any abscess formation.

## 2016-10-13 NOTE — Patient Instructions (Signed)
Thank you for coming in,   Please try warm compress on the area. I have provided antibiotics. Please take a probiotic with this. Please let me know if he did have any improvement of the symptoms.   Please feel free to call with any questions or concerns at any time, at [phone]. --Dr. Raeford Razor

## 2017-03-07 DIAGNOSIS — H5213 Myopia, bilateral: Secondary | ICD-10-CM | POA: Diagnosis not present

## 2017-09-13 ENCOUNTER — Encounter: Payer: Self-pay | Admitting: Family

## 2017-09-13 ENCOUNTER — Ambulatory Visit (INDEPENDENT_AMBULATORY_CARE_PROVIDER_SITE_OTHER): Payer: 59 | Admitting: Family

## 2017-09-13 VITALS — BP 126/78 | HR 80 | Temp 98.4°F | Ht 65.0 in | Wt 152.0 lb

## 2017-09-13 DIAGNOSIS — J209 Acute bronchitis, unspecified: Secondary | ICD-10-CM

## 2017-09-13 MED ORDER — HYDROCOD POLST-CPM POLST ER 10-8 MG/5ML PO SUER: 5.0000 mL | Freq: Every evening | ORAL | 0 refills | Status: DC | PRN

## 2017-09-13 MED ORDER — DOXYCYCLINE HYCLATE 100 MG PO TABS: 100.0000 mg | ORAL_TABLET | Freq: Two times a day (BID) | ORAL | 0 refills | Status: DC

## 2017-09-13 NOTE — Progress Notes (Signed)
  Brooke Mcbride is a 47 y.o. female with the following history as recorded in Epic[employer]:  Patient Active Problem List   Diagnosis Date Noted  . Rash 10/13/2016  . Depression 07/19/2016  . Contact dermatitis 07/19/2016  . Neoplasm of uncertain behavior of skin 07/19/2016  . Cat bite of hand, right, initial encounter 07/07/2016  . Loss of weight 11/12/2013  . Well adult exam 10/07/2012  . SMOKER 01/14/2009  . Hypothyroidism 10/24/2006  . DSORD BIPOLAR I SNGL MANIC EPISODE, UNSPC 10/24/2006  . ALLERGIC RHINITIS 10/24/2006  . ALCOHOL ABUSE, HX OF 10/24/2006  . DRUG ABUSE, HX OF 10/24/2006    Current Outpatient Medications  Medication Sig Dispense Refill  . buPROPion (WELLBUTRIN XL) 300 MG 24 hr tablet TAKE 1 TABLET BY MOUTH EVERY DAY EVERY MORNING  1  . lamoTRIgine (LAMICTAL) 200 MG tablet Take 200 mg by mouth daily.    . traZODone (DESYREL) 150 MG tablet Take 225 mg by mouth at bedtime.     . chlorpheniramine-HYDROcodone (TUSSIONEX PENNKINETIC ER) 10-8 MG/5ML SUER Take 5 mLs by mouth at bedtime as needed for cough. 75 mL 0  . doxycycline (VIBRA-TABS) 100 MG tablet Take 1 tablet (100 mg total) by mouth 2 (two) times daily. 20 tablet 0   No current facility-administered medications for this visit.     Allergies: Erythromycin and Penicillins  History reviewed. No pertinent past medical history.  History reviewed. No pertinent surgical history.  History reviewed. No pertinent family history.  Social History   Tobacco Use  . Smoking status: Current Every Day Smoker  . Smokeless tobacco: Never Used  Substance Use Topics  . Alcohol use: Not on file    Subjective:  Patient presents with 10 day history of cough/ congestion; no fever, no chest pain- "heavy sensation in the chest"; not prone to asthma, bronchitis or pneumonia;   LMP- 3 weeks ago;   Objective:  Vitals:   09/13/17 1413  BP: 126/78  Pulse: 80  Temp: 98.4 F (36.9 C)  TempSrc: Oral  SpO2: 95%  Weight: 152  lb 0.6 oz (69 kg)  Height: 5\' 5"  (1.651 m)    General: Well developed, well nourished, in no acute distress  Skin : Warm and dry.  Head: Normocephalic and atraumatic  Eyes: Sclera and conjunctiva clear; pupils round and reactive to light; extraocular movements intact  Ears: External normal; canals clear; tympanic membranes normal  Oropharynx: Pink, supple. No suspicious lesions  Neck: Supple without thyromegaly, adenopathy  Lungs: Respirations unlabored; wheezing noted in all 4 lobes; CVS exam: normal rate and regular rhythm.  Neurologic: Alert and oriented; speech intact; face symmetrical; moves all extremities well; CNII-XII intact without focal deficit   Assessment:  1. Acute bronchitis, unspecified organism     Plan:  Rx for Doxycycline 100 mg bid x 10 days, Tussionex at night; sample of BREO 100 qd x 10-14 days; increase fluids,rest and follow-up worse, no better.    No follow-ups on file.  No orders of the defined types were placed in this encounter.   Requested Prescriptions   Signed Prescriptions Disp Refills  . doxycycline (VIBRA-TABS) 100 MG tablet 20 tablet 0    Sig: Take 1 tablet (100 mg total) by mouth 2 (two) times daily.  . chlorpheniramine-HYDROcodone (TUSSIONEX PENNKINETIC ER) 10-8 MG/5ML SUER 75 mL 0    Sig: Take 5 mLs by mouth at bedtime as needed for cough.

## 2018-05-10 ENCOUNTER — Other Ambulatory Visit: Payer: Self-pay | Admitting: Internal Medicine

## 2018-05-10 NOTE — Telephone Encounter (Signed)
Copied from [place] [id number]. Topic: Quick Communication - Rx Refill/Question >> May 10, 2018  2:33 PM Burchel, Abbi R wrote: Medication: traZODone (DESYREL) 150 MG tablet, buPROPion (WELLBUTRIN XL) 300 MG 24 hr tablet  [id number] [place] [place] [address] [place] [address] Phone: [phone] Fax: [phone]    Pt was advised that RX refills may take up to 3 business days. We ask that you follow-up with your pharmacy.

## 2018-05-10 NOTE — Telephone Encounter (Signed)
Requested medication (s) are due for refill today: yes  Requested medication (s) are on the active medication list: historical provider  Last refill:  Trazodone: 10/07/12                   Bupropion: 09/13/17  Future visit scheduled: yes 1 month for CPE  Notes to clinic:  Called pt to schedule appt pt scheduled a CPE in 1 month Medication pt is requesting was previously prescribed by the [facility]. Pt stated she was having to pay $ 145.00 per visit for medication refills. Pt stated she was not getting therapy, only refills.   Requested Prescriptions  Pending Prescriptions Disp Refills   traZODone (DESYREL) 150 MG tablet      Sig: Take 1.5 tablets (225 mg total) by mouth at bedtime.     Psychiatry: Antidepressants - Serotonin Modulator Failed - 05/10/2018  2:43 PM      Failed - Completed PHQ-2 or PHQ-9 in the last 360 days.      Failed - Valid encounter within last 6 months    Recent Outpatient Visits          7 months ago Acute bronchitis, unspecified organism   [facility] [place], [place] [facility], [place]   1 year ago [facility], Enid Baas, MD   1 year ago Well adult exam   [facility] Plot[facility], Evie Lacks, MD   1 year ago Cat bite of hand, right, initial encounter   [place], Evie Lacks, MD   2 years ago Well adult exam   [facility] Plot[facility], Evie Lacks, MD      Future Appointments            In 1 month Plotnikov, Evie Lacks, MD [facility] [place]          buPROPion (WELLBUTRIN XL) 300 MG 24 hr tablet  1     Psychiatry: Antidepressants - bupropion Failed - 05/10/2018  2:43 PM      Failed - Completed PHQ-2 or PHQ-9 in the last 360 days.      Failed - Valid encounter within last 6 months    Recent Outpatient Visits          7 months ago Acute bronchitis, unspecified organism   [facility] [place], Mar[facility] Repress, [place]   1 year ago [facility] -[place] [facility], Enid Baas, MD   1 year ago Well adult exam   [place] Plotnikov, Evie Lacks, MD   1 year ago Cat bite of hand, right, initial encounter   [place], Evie Lacks, MD   2 years ago Well adult exam   [place], MD      Future Appointments            In 1 month Plotnikov, Evie Lacks, MD [place], [place] BP in normal range    BP Readings from Last 1 Encounters:  09/13/17 126/78

## 2018-05-14 MED ORDER — TRAZODONE HCL 150 MG PO TABS: 225.0000 mg | ORAL_TABLET | Freq: Every day | ORAL | 3 refills | Status: DC

## 2018-05-14 MED ORDER — BUPROPION HCL ER (XL) 300 MG PO TB24: ORAL_TABLET | ORAL | 3 refills | Status: DC

## 2018-05-14 NOTE — Telephone Encounter (Signed)
sch OV 

## 2018-06-04 DIAGNOSIS — M545 Low back pain: Secondary | ICD-10-CM | POA: Diagnosis not present

## 2018-06-09 ENCOUNTER — Other Ambulatory Visit: Payer: Self-pay | Admitting: Internal Medicine

## 2018-06-11 NOTE — Telephone Encounter (Signed)
sch OV in 3 mo

## 2018-06-13 ENCOUNTER — Encounter: Payer: 59 | Admitting: Internal Medicine

## 2018-09-13 ENCOUNTER — Other Ambulatory Visit: Payer: Self-pay | Admitting: Internal Medicine

## 2018-09-17 ENCOUNTER — Encounter: Payer: Self-pay | Admitting: Internal Medicine

## 2018-09-17 ENCOUNTER — Ambulatory Visit (INDEPENDENT_AMBULATORY_CARE_PROVIDER_SITE_OTHER): Payer: 59 | Admitting: Internal Medicine

## 2018-09-17 DIAGNOSIS — F3289 Other specified depressive episodes: Secondary | ICD-10-CM

## 2018-09-17 DIAGNOSIS — N943 Premenstrual tension syndrome: Secondary | ICD-10-CM | POA: Diagnosis not present

## 2018-09-17 DIAGNOSIS — E034 Atrophy of thyroid (acquired): Secondary | ICD-10-CM | POA: Diagnosis not present

## 2018-09-17 MED ORDER — BUPROPION HCL ER (XL) 300 MG PO TB24: ORAL_TABLET | ORAL | 2 refills | Status: DC

## 2018-09-17 MED ORDER — PAROXETINE HCL 10 MG PO TABS: 10.0000 mg | ORAL_TABLET | Freq: Every day | ORAL | 2 refills | Status: DC

## 2018-09-17 MED ORDER — TRAZODONE HCL 150 MG PO TABS: 225.0000 mg | ORAL_TABLET | Freq: Every day | ORAL | 2 refills | Status: DC

## 2018-09-17 NOTE — Progress Notes (Signed)
Virtual Visit via Video Note  I connected with Brooke Mcbride on 09/17/18 at  1:20 PM EDT by a video enabled telemedicine application and verified that I am speaking with the correct person using two identifiers.   I discussed the limitations of evaluation and management by telemedicine and the availability of in person appointments. The patient expressed understanding and agreed to proceed.  History of Present Illness: The patient is complaining of worsening PMS symptoms (moody, tearful, upset) that usually start 1-1/2-week before her monthly cycle.  Is been happening for several months.  Is been getting worse.  She rates it on the scale from 0-10 9 out of 10.  She is been taking her other meds as before.  Not taking her vitamins.  Her monthly cycle lasts for 2 days. Follow-up on depression.  She stopped taking Lamictal a while ago Follow-up on Hashimoto's thyroiditis.  There has been no runny nose, cough, chest pain, shortness of breath, abdominal pain, diarrhea, constipation, arthralgias, skin rashes.   Observations/Objective: The patient appears to be in no acute distress, looks well.  Assessment and Plan:  See my Assessment and Plan. Follow Up Instructions:    I discussed the assessment and treatment plan with the patient. The patient was provided an opportunity to ask questions and all were answered. The patient agreed with the plan and demonstrated an understanding of the instructions.   The patient was advised to call back or seek an in-person evaluation if the symptoms worsen or if the condition fails to improve as anticipated.  I provided face-to-face time during this encounter. We were at different locations.   Walker Kehr, MD

## 2018-09-17 NOTE — Assessment & Plan Note (Signed)
Check free T4 and TSH. 

## 2018-09-17 NOTE — Assessment & Plan Note (Signed)
Worsening symptoms lately.  Added Paxil.  Obtain lab work

## 2018-09-17 NOTE — Assessment & Plan Note (Signed)
Obtain lab work, continue with current Wellbutrin and trazodone

## 2019-07-09 ENCOUNTER — Other Ambulatory Visit: Payer: Self-pay | Admitting: Internal Medicine

## 2020-02-12 ENCOUNTER — Ambulatory Visit (INDEPENDENT_AMBULATORY_CARE_PROVIDER_SITE_OTHER): Payer: 59 | Admitting: Internal Medicine

## 2020-02-12 ENCOUNTER — Encounter: Payer: Self-pay | Admitting: Internal Medicine

## 2020-02-12 ENCOUNTER — Other Ambulatory Visit: Payer: Self-pay

## 2020-02-12 VITALS — BP 168/100 | HR 86 | Temp 98.1°F | Ht 65.0 in | Wt 156.0 lb

## 2020-02-12 DIAGNOSIS — F3289 Other specified depressive episodes: Secondary | ICD-10-CM

## 2020-02-12 DIAGNOSIS — G4700 Insomnia, unspecified: Secondary | ICD-10-CM | POA: Diagnosis not present

## 2020-02-12 DIAGNOSIS — E034 Atrophy of thyroid (acquired): Secondary | ICD-10-CM

## 2020-02-12 DIAGNOSIS — F1010 Alcohol abuse, uncomplicated: Secondary | ICD-10-CM

## 2020-02-12 LAB — HEMOGLOBIN A1C: Hgb A1c MFr Bld: 5.2 % (ref 4.6–6.5)

## 2020-02-12 LAB — T4, FREE: Free T4: 0.59 ng/dL — ABNORMAL LOW (ref 0.60–1.60)

## 2020-02-12 LAB — CBC
HCT: 44.9 % (ref 36.0–46.0)
Hemoglobin: 15.2 g/dL — ABNORMAL HIGH (ref 12.0–15.0)
MCHC: 33.9 g/dL (ref 30.0–36.0)
MCV: 104.5 fl — ABNORMAL HIGH (ref 78.0–100.0)
Platelets: 264 10*3/uL (ref 150.0–400.0)
RBC: 4.3 Mil/uL (ref 3.87–5.11)
RDW: 13.3 % (ref 11.5–15.5)
WBC: 6.6 10*3/uL (ref 4.0–10.5)

## 2020-02-12 LAB — COMPREHENSIVE METABOLIC PANEL
ALT: 65 U/L — ABNORMAL HIGH (ref 0–35)
AST: 46 U/L — ABNORMAL HIGH (ref 0–37)
Albumin: 4.7 g/dL (ref 3.5–5.2)
Alkaline Phosphatase: 63 U/L (ref 39–117)
BUN: 18 mg/dL (ref 6–23)
CO2: 28 mEq/L (ref 19–32)
Calcium: 9.8 mg/dL (ref 8.4–10.5)
Chloride: 102 mEq/L (ref 96–112)
Creatinine, Ser: 1 mg/dL (ref 0.40–1.20)
GFR: 65.83 mL/min (ref >60.00)
Glucose, Bld: 94 mg/dL (ref 70–99)
Potassium: 4.8 mEq/L (ref 3.5–5.1)
Sodium: 136 mEq/L (ref 135–145)
Total Bilirubin: 0.9 mg/dL (ref 0.2–1.2)
Total Protein: 7.6 g/dL (ref 6.0–8.3)

## 2020-02-12 LAB — TSH: TSH: 2.71 u[IU]/mL (ref 0.35–4.50)

## 2020-02-12 LAB — LIPID PANEL
Cholesterol: 250 mg/dL — ABNORMAL HIGH (ref 0–200)
HDL: 94.1 mg/dL (ref >39.00)
LDL Cholesterol: 133 mg/dL — ABNORMAL HIGH (ref 0–99)
NonHDL: 155.97
Total CHOL/HDL Ratio: 3
Triglycerides: 115 mg/dL (ref 0.0–149.0)
VLDL: 23 mg/dL (ref 0.0–40.0)

## 2020-02-12 LAB — VITAMIN D 25 HYDROXY (VIT D DEFICIENCY, FRACTURES): VITD: 42.3 ng/mL (ref 30.00–100.00)

## 2020-02-12 LAB — VITAMIN B12: Vitamin B-12: 272 pg/mL (ref 211–911)

## 2020-02-12 MED ORDER — LAMOTRIGINE 150 MG PO TABS: 150.0000 mg | ORAL_TABLET | Freq: Every day | ORAL | 1 refills | Status: DC

## 2020-02-12 MED ORDER — TRAZODONE HCL 50 MG PO TABS: 50.0000 mg | ORAL_TABLET | Freq: Every day | ORAL | 1 refills | Status: DC

## 2020-02-12 MED ORDER — NALTREXONE HCL 50 MG PO TABS: 50.0000 mg | ORAL_TABLET | Freq: Every day | ORAL | 1 refills | Status: DC

## 2020-02-12 NOTE — Patient Instructions (Addendum)
We have sent in the naltrexone to take 1 pill daily.   We have sent in the trazodone to use at night time.   We have sent in the lamictal to take daily. Keep taking wellbutrin (bupropion) for the next 2 weeks so the lamictal can get in your system.

## 2020-02-12 NOTE — Assessment & Plan Note (Signed)
Checking TSH and T4. Adjust as needed.

## 2020-02-12 NOTE — Assessment & Plan Note (Signed)
Continue on wellbutrin and resume lamictal 150 mg daily. Removed paxil from list. Will also resume trazodone for sleep.

## 2020-02-12 NOTE — Assessment & Plan Note (Signed)
Rx trazodone. Encouraged not to take if drinking alcohol. She did want dose reduction as she felt being off it for some time the previous dose of 150 mg was way too strong.

## 2020-02-12 NOTE — Assessment & Plan Note (Signed)
Rx naltrexone to take 50 mg daily. Follow up PCP 2-3 weeks and dosing can be increased to 100 mg daily at that time if efficacy if not enough. She is having concerning drinking behaviors and we talked about various strategies to help quit alcohol. She declines in patient rehab stay at this time.

## 2020-02-12 NOTE — Progress Notes (Signed)
Subjective:   Patient ID: Brooke Mcbride, female    DOB: 1971/01/16, 49 y.o.   MRN: [id number]  HPI The patient is a 49 YO female coming in for concerns about several things including thyroid (previously on thyroid medicine, no labs or routine care in some years, would like retesting and resume medication if needed), alcohol use (starting drinking more in the past year or more, drinking to pass out in the evenings due to not being able to sleep, is getting to the point where she is drinking at lunchtime and going back to work afterwards, her husband discovered this and is helping to support her trying to cut back, she has suffered from alcohol abuse in the past and has tried many different things over the last 25 years, AA was not for her, she has done rehab stays and outpatient counseling/rehab, previous time she did take 1 month sick time from her job, she has never reported to her job about her alcohol or had any problems at work or legally related to her drinking, is drinking until passing out on cough currently most days, sometimes lunch drinking, denies morning drinking currently) and depression (did take herself off medications about 1 year ago as she was drinking more and concerned about mixing alcohol and medications, she is still taking wellbutrin, not taking paxil for at least a month and trazodone for some time, she denies SI/HI, with moderate to severe depression symptoms, has been on lamictal in the past which was decent for control and wanting to try again).   Review of Systems  Constitutional: Positive for activity change.  HENT: Negative.   Eyes: Negative.   Respiratory: Negative for cough, chest tightness and shortness of breath.   Cardiovascular: Negative for chest pain, palpitations and leg swelling.  Gastrointestinal: Negative for abdominal distention, abdominal pain, constipation, diarrhea, nausea and vomiting.  Musculoskeletal: Negative.   Skin: Negative.    Neurological: Negative.   Psychiatric/Behavioral: Positive for decreased concentration, dysphoric mood and sleep disturbance.    Objective:  Physical Exam Constitutional:      Appearance: She is well-developed and well-nourished.  HENT:     Head: Normocephalic and atraumatic.  Eyes:     Extraocular Movements: EOM normal.  Cardiovascular:     Rate and Rhythm: Normal rate and regular rhythm.  Pulmonary:     Effort: Pulmonary effort is normal. No respiratory distress.     Breath sounds: Normal breath sounds. No wheezing or rales.  Abdominal:     General: Bowel sounds are normal. There is no distension.     Palpations: Abdomen is soft.     Tenderness: There is no abdominal tenderness. There is no rebound.  Musculoskeletal:        General: No edema.     Cervical back: Normal range of motion.  Skin:    General: Skin is warm and dry.  Neurological:     Mental Status: She is alert and oriented to person, place, and time.     Coordination: Coordination normal.  Psychiatric:        Mood and Affect: Mood and affect normal.     Vitals:   02/12/20 1304  BP: (!) 168/100  Pulse: 86  Temp: 98.1 F (36.7 C)  TempSrc: Oral  SpO2: 98%  Weight: 156 lb (70.8 kg)  Height: 5\' 5"  (1.651 m)   This visit occurred during the SARS-CoV-2 public health emergency.  Safety protocols were in place, including screening questions prior to the visit, additional  usage of staff PPE, and extensive cleaning of exam room while observing appropriate contact time as indicated for disinfecting solutions.   Assessment & Plan:  Visit time 30 minutes in face to face communication with patient and coordination of care, additional 10 minutes spent in record review, coordination or care, ordering tests, communicating/referring to other healthcare professionals, documenting in medical records all on the same day of the visit for total time 40 minutes spent on the visit.

## 2020-02-17 ENCOUNTER — Encounter: Payer: Self-pay | Admitting: Internal Medicine

## 2020-02-17 ENCOUNTER — Telehealth: Payer: Self-pay | Admitting: Internal Medicine

## 2020-02-17 NOTE — Telephone Encounter (Signed)
Patient is requesting a call back in regards to her recent lab work and she was wondering if Brooke Mcbride could be called [id number] [place] [place] [address]. She said she had discussed it with Dr. Sharlet Salina on 12.16.21.

## 2020-02-17 NOTE — Telephone Encounter (Signed)
I reviewed Dr. Nathanial Millman note.  It is a complicated situation.  Brooke Mcbride was supposed to start on trazodone, resume Wellbutrin and follow-up with me in 2 weeks after her appointment with her.  I do not see anything in relation to [facility].  No change to the plan.  Thanks

## 2020-02-17 NOTE — Telephone Encounter (Signed)
Pt informed of below.  

## 2020-03-04 ENCOUNTER — Ambulatory Visit (INDEPENDENT_AMBULATORY_CARE_PROVIDER_SITE_OTHER): Payer: 59 | Admitting: Internal Medicine

## 2020-03-04 ENCOUNTER — Other Ambulatory Visit: Payer: Self-pay

## 2020-03-04 ENCOUNTER — Encounter: Payer: Self-pay | Admitting: Internal Medicine

## 2020-03-04 VITALS — BP 138/86 | HR 95 | Temp 97.9°F | Wt 156.0 lb

## 2020-03-04 DIAGNOSIS — F1111 Opioid abuse, in remission: Secondary | ICD-10-CM

## 2020-03-04 DIAGNOSIS — Z720 Tobacco use: Secondary | ICD-10-CM

## 2020-03-04 DIAGNOSIS — F3170 Bipolar disorder, currently in remission, most recent episode unspecified: Secondary | ICD-10-CM | POA: Diagnosis not present

## 2020-03-04 DIAGNOSIS — D751 Secondary polycythemia: Secondary | ICD-10-CM

## 2020-03-04 DIAGNOSIS — F1010 Alcohol abuse, uncomplicated: Secondary | ICD-10-CM | POA: Diagnosis not present

## 2020-03-04 DIAGNOSIS — K7010 Alcoholic hepatitis without ascites: Secondary | ICD-10-CM

## 2020-03-04 DIAGNOSIS — G4700 Insomnia, unspecified: Secondary | ICD-10-CM | POA: Diagnosis not present

## 2020-03-04 DIAGNOSIS — F3341 Major depressive disorder, recurrent, in partial remission: Secondary | ICD-10-CM

## 2020-03-04 DIAGNOSIS — E034 Atrophy of thyroid (acquired): Secondary | ICD-10-CM

## 2020-03-04 MED ORDER — VITAMIN D3 50 MCG (2000 UT) PO CAPS: 2000.0000 [IU] | ORAL_CAPSULE | Freq: Every day | ORAL | 3 refills | Status: DC

## 2020-03-04 MED ORDER — ESZOPICLONE 1 MG PO TABS: 0.5000 mg | ORAL_TABLET | Freq: Every evening | ORAL | 0 refills | Status: DC | PRN

## 2020-03-04 MED ORDER — B COMPLEX PLUS PO TABS: 1.0000 | ORAL_TABLET | Freq: Every day | ORAL | 3 refills | Status: DC

## 2020-03-04 NOTE — Assessment & Plan Note (Signed)
Chronic with recent exacerbation.  It is aggravated by alcoholism.  Continue with Wellbutrin, Lamictal, trazodone.  No suicidal ideation.

## 2020-03-04 NOTE — Assessment & Plan Note (Signed)
Elevated liver function tests were discussed with Brooke Mcbride.  We will need to obtain a hepatic ultrasound next time I see her.

## 2020-03-04 NOTE — Assessment & Plan Note (Addendum)
In remission She is on naltrexone, Wellbutrin

## 2020-03-04 NOTE — Assessment & Plan Note (Signed)
Likely due to tobacco use.  Discussed.  Monitor CBC

## 2020-03-04 NOTE — Assessment & Plan Note (Signed)
Currently smoking 3/4 pack a day.  Discussed.  She is unable to stop at the moment.

## 2020-03-04 NOTE — Assessment & Plan Note (Signed)
Chronic and refractory.  It is a difficult situation.   She used to drink herself to sleep.  Now her sleep is very superficial interrupted.  She has to get up at 4:00 in the morning to go to work. Now her drinking is down to 2-3 drinks of vodka at night.  She is doing much better on Wellbutrin, Lamictal and naltrexone.  She takes trazodone at night but it does make her fall asleep. In the past she had a good luck, with Lunesta.  She drank a little bit when she took it but had no side effects.  I think it would be reasonable to try again.  She was instructed not to take it close to her last drink.  She needs to space her last drink and Lunesta by 2 hours.  Potential benefits of a long term benzodiazepines  use as well as potential risks  and complications were explained to the patient and were aknowledged.

## 2020-03-04 NOTE — Assessment & Plan Note (Signed)
Recent exacerbation currently or in remission.  Dr. Frutoso Chase recent note was reviewed and discussed with the patient. Now her drinking is down to 2-3 drinks of vodka at night.  She is doing much better on Wellbutrin, Lamictal and naltrexone.  No drug use.  She was advised to spend time outside, join a yoga class, take her medication.

## 2020-03-04 NOTE — Progress Notes (Signed)
Subjective:  Patient ID: Brooke Mcbride, female    DOB: 1971-01-15  Age: 50 y.o. 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  She is complaining of insomnia.  She used to drink herself to sleep.  Now her sleep is very superficial interrupted.  She has to get up at 4:00 in the morning to go to work. Now her drinking is down to 2-3 drinks of vodka at night.  She is doing much better on Wellbutrin, Lamictal and naltrexone.  She takes trazodone at night but it does make her fall asleep. She denies using drugs.  Outpatient Medications Prior to Visit  Medication Sig Dispense Refill  . buPROPion (WELLBUTRIN XL) 300 MG 24 hr tablet TAKE 1 TABLET BY MOUTH EVERY DAY IN THE MORNING 90 tablet 1  . lamoTRIgine (LAMICTAL) 150 MG tablet Take 1 tablet (150 mg total) by mouth daily. 30 tablet 1  . naltrexone (DEPADE) 50 MG tablet Take 1 tablet (50 mg total) by mouth daily. 30 tablet 1  . traZODone (DESYREL) 50 MG tablet Take 1 tablet (50 mg total) by mouth at bedtime. 30 tablet 1   No facility-administered medications prior to visit.    ROS: Review of Systems  Constitutional: Positive for fatigue. Negative for activity change, appetite change, chills and unexpected weight change.  HENT: Negative for congestion, mouth sores and sinus pressure.   Eyes: Negative for visual disturbance.  Respiratory: Negative for cough and chest tightness.   Gastrointestinal: Negative for abdominal pain and nausea.  Genitourinary: Negative for difficulty urinating, frequency and vaginal pain.  Musculoskeletal: Negative for back pain and gait problem.  Skin: Negative for pallor and rash.  Neurological: Negative for dizziness, tremors, weakness, numbness and headaches.  Hematological: Does not bruise/bleed easily.  Psychiatric/Behavioral: Positive for decreased concentration, dysphoric  mood and sleep disturbance. Negative for confusion, self-injury and suicidal ideas.    Objective:  BP 138/86 (BP Location: Left Arm)   Pulse 95   Temp 97.9 F (36.6 C) (Oral)   Wt 156 lb (70.8 kg)   SpO2 98%   BMI 25.96 kg/m   BP Readings from Last 3 Encounters:  03/04/20 138/86  02/12/20 (!) 168/100  09/13/17 126/78    Wt Readings from Last 3 Encounters:  03/04/20 156 lb (70.8 kg)  02/12/20 156 lb (70.8 kg)  09/13/17 152 lb 0.6 oz (69 kg)    Physical Exam Constitutional:      General: She is not in acute distress.    Appearance: She is well-developed and normal weight.  HENT:     Head: Normocephalic.     Right Ear: External ear normal.     Left Ear: External ear normal.     Nose: Nose normal.     Mouth/Throat:     Mouth: Oropharynx is clear and moist.  Eyes:     General:        Right eye: No discharge.        Left eye: No discharge.     Conjunctiva/sclera: Conjunctivae normal.     Pupils: Pupils are equal, round, and reactive to light.  Neck:     Thyroid: No thyromegaly.     Vascular: No JVD.     Trachea: No tracheal deviation.  Cardiovascular:     Rate and Rhythm: Normal rate and regular rhythm.     Heart sounds: Normal heart  sounds.  Pulmonary:     Effort: No respiratory distress.     Breath sounds: No stridor. No wheezing.  Abdominal:     General: Bowel sounds are normal. There is no distension.     Palpations: Abdomen is soft. There is no mass.     Tenderness: There is no abdominal tenderness. There is no guarding or rebound.  Musculoskeletal:        General: No tenderness or edema.     Cervical back: Normal range of motion and neck supple.  Lymphadenopathy:     Cervical: No cervical adenopathy.  Skin:    Findings: No erythema or rash.  Neurological:     Mental Status: She is oriented to person, place, and time.     Cranial Nerves: No cranial nerve deficit.     Motor: No abnormal muscle tone.     Coordination: Coordination normal.     Gait:  Gait normal.     Deep Tendon Reflexes: Reflexes normal.  Psychiatric:        Mood and Affect: Mood and affect normal.        Behavior: Behavior normal.        Thought Content: Thought content normal.        Judgment: Judgment normal.     A total time of >45 minutes was spent preparing to see the patient, reviewing tests, x-rays, operative reports and outside records.  Also, obtaining history and performing comprehensive physical exam.  Additionally, counseling the patient regarding the above listed issues -use of alcohol, antidepressants, benzodiazepines.   Finally, documenting clinical information in the health records, coordination of care, educating the patient. It is a complex case.   Lab Results  Component Value Date   WBC 6.6 02/12/2020   HGB 15.2 (H) 02/12/2020   HCT 44.9 02/12/2020   PLT 264.0 02/12/2020   GLUCOSE 94 02/12/2020   CHOL 250 (H) 02/12/2020   TRIG 115.0 02/12/2020   HDL 94.10 02/12/2020   LDLDIRECT 143.4 11/05/2012   LDLCALC 133 (H) 02/12/2020   ALT 65 (H) 02/12/2020   AST 46 (H) 02/12/2020   NA 136 02/12/2020   K 4.8 02/12/2020   CL 102 02/12/2020   CREATININE 1.00 02/12/2020   BUN 18 02/12/2020   CO2 28 02/12/2020   TSH 2.71 02/12/2020   INR 4.1 (A) 09/05/2012   HGBA1C 5.2 02/12/2020    MM Digital Diagnostic Bilat  Result Date: 11/13/2011 *RADIOLOGY REPORT* Clinical Data:  Further evaluation of bilateral asymmetries DIGITAL DIAGNOSTIC BILATERAL MAMMOGRAM Comparison:  09/19/2011 Findings:  No persistent abnormality on either side with spot compression views IMPRESSION: Negative BI-RADS CATEGORY 1:  Negative. RECOMMENDATION: Return to annual screening mammography Original Report Authenticated By: Rachael Fee, M.D.    Assessment & Plan:    Walker Kehr, MD

## 2020-03-04 NOTE — Assessment & Plan Note (Signed)
Thyroid tests were reviewed.  Will obtain free T4 free T3 and TSH in 6-8 weeks

## 2020-03-07 ENCOUNTER — Other Ambulatory Visit: Payer: Self-pay | Admitting: Internal Medicine

## 2020-04-01 ENCOUNTER — Other Ambulatory Visit: Payer: Self-pay | Admitting: Internal Medicine

## 2020-04-08 ENCOUNTER — Other Ambulatory Visit: Payer: Self-pay | Admitting: Internal Medicine

## 2020-04-09 NOTE — Telephone Encounter (Signed)
See below

## 2020-04-15 ENCOUNTER — Ambulatory Visit: Payer: 59 | Admitting: Internal Medicine

## 2020-04-15 ENCOUNTER — Other Ambulatory Visit: Payer: Self-pay | Admitting: Internal Medicine

## 2020-08-29 ENCOUNTER — Inpatient Hospital Stay (HOSPITAL_COMMUNITY)
Admission: EM | Admit: 2020-08-29 | Discharge: 2020-09-01 | DRG: 917 | Disposition: A | Discharge status: Other Acute Inpatient Hospital | Payer: 59 | Attending: Internal Medicine | Admitting: Internal Medicine

## 2020-08-29 ENCOUNTER — Other Ambulatory Visit: Payer: Self-pay

## 2020-08-29 ENCOUNTER — Encounter (HOSPITAL_COMMUNITY): Payer: Self-pay | Admitting: Emergency Medicine

## 2020-08-29 ENCOUNTER — Emergency Department (HOSPITAL_COMMUNITY): Payer: 59

## 2020-08-29 DIAGNOSIS — F17200 Nicotine dependence, unspecified, uncomplicated: Secondary | ICD-10-CM | POA: Diagnosis present

## 2020-08-29 DIAGNOSIS — G928 Other toxic encephalopathy: Secondary | ICD-10-CM | POA: Diagnosis present

## 2020-08-29 DIAGNOSIS — Z880 Allergy status to penicillin: Secondary | ICD-10-CM

## 2020-08-29 DIAGNOSIS — T50901A Poisoning by unspecified drugs, medicaments and biological substances, accidental (unintentional), initial encounter: Secondary | ICD-10-CM | POA: Diagnosis present

## 2020-08-29 DIAGNOSIS — T50902A Poisoning by unspecified drugs, medicaments and biological substances, intentional self-harm, initial encounter: Secondary | ICD-10-CM | POA: Diagnosis not present

## 2020-08-29 DIAGNOSIS — E872 Acidosis: Secondary | ICD-10-CM | POA: Diagnosis present

## 2020-08-29 DIAGNOSIS — F1011 Alcohol abuse, in remission: Secondary | ICD-10-CM | POA: Diagnosis present

## 2020-08-29 DIAGNOSIS — F3130 Bipolar disorder, current episode depressed, mild or moderate severity, unspecified: Secondary | ICD-10-CM | POA: Diagnosis present

## 2020-08-29 DIAGNOSIS — R569 Unspecified convulsions: Secondary | ICD-10-CM | POA: Diagnosis present

## 2020-08-29 DIAGNOSIS — K7010 Alcoholic hepatitis without ascites: Secondary | ICD-10-CM | POA: Diagnosis present

## 2020-08-29 DIAGNOSIS — T43212A Poisoning by selective serotonin and norepinephrine reuptake inhibitors, intentional self-harm, initial encounter: Secondary | ICD-10-CM | POA: Diagnosis not present

## 2020-08-29 DIAGNOSIS — F1010 Alcohol abuse, uncomplicated: Secondary | ICD-10-CM | POA: Diagnosis present

## 2020-08-29 DIAGNOSIS — G4700 Insomnia, unspecified: Secondary | ICD-10-CM | POA: Diagnosis present

## 2020-08-29 DIAGNOSIS — F419 Anxiety disorder, unspecified: Secondary | ICD-10-CM | POA: Diagnosis present

## 2020-08-29 DIAGNOSIS — F319 Bipolar disorder, unspecified: Secondary | ICD-10-CM | POA: Diagnosis present

## 2020-08-29 DIAGNOSIS — R001 Bradycardia, unspecified: Secondary | ICD-10-CM | POA: Diagnosis present

## 2020-08-29 DIAGNOSIS — J9601 Acute respiratory failure with hypoxia: Secondary | ICD-10-CM | POA: Diagnosis present

## 2020-08-29 DIAGNOSIS — Z20822 Contact with and (suspected) exposure to covid-19: Secondary | ICD-10-CM | POA: Diagnosis present

## 2020-08-29 DIAGNOSIS — R092 Respiratory arrest: Secondary | ICD-10-CM

## 2020-08-29 DIAGNOSIS — Z9152 Personal history of nonsuicidal self-harm: Secondary | ICD-10-CM

## 2020-08-29 DIAGNOSIS — Z79899 Other long term (current) drug therapy: Secondary | ICD-10-CM

## 2020-08-29 DIAGNOSIS — Z9151 Personal history of suicidal behavior: Secondary | ICD-10-CM

## 2020-08-29 DIAGNOSIS — D485 Neoplasm of uncertain behavior of skin: Secondary | ICD-10-CM | POA: Diagnosis present

## 2020-08-29 DIAGNOSIS — T450X2A Poisoning by antiallergic and antiemetic drugs, intentional self-harm, initial encounter: Secondary | ICD-10-CM | POA: Diagnosis present

## 2020-08-29 LAB — CBC WITH DIFFERENTIAL/PLATELET
Abs Immature Granulocytes: 0.04 10*3/uL (ref 0.00–0.07)
Basophils Absolute: 0.1 10*3/uL (ref 0.0–0.1)
Basophils Relative: 1 %
Eosinophils Absolute: 0.4 10*3/uL (ref 0.0–0.5)
Eosinophils Relative: 4 %
HCT: 38.8 % (ref 36.0–46.0)
Hemoglobin: 13 g/dL (ref 12.0–15.0)
Immature Granulocytes: 0 %
Lymphocytes Relative: 42 %
Lymphs Abs: 4.4 10*3/uL — ABNORMAL HIGH (ref 0.7–4.0)
MCH: 35.6 pg — ABNORMAL HIGH (ref 26.0–34.0)
MCHC: 33.5 g/dL (ref 30.0–36.0)
MCV: 106.3 fL — ABNORMAL HIGH (ref 80.0–100.0)
Monocytes Absolute: 0.7 10*3/uL (ref 0.1–1.0)
Monocytes Relative: 6 %
Neutro Abs: 4.9 10*3/uL (ref 1.7–7.7)
Neutrophils Relative %: 47 %
Platelets: 219 10*3/uL (ref 150–400)
RBC: 3.65 MIL/uL — ABNORMAL LOW (ref 3.87–5.11)
RDW: 12.2 % (ref 11.5–15.5)
WBC: 10.5 10*3/uL (ref 4.0–10.5)
nRBC: 0 % (ref 0.0–0.2)

## 2020-08-29 LAB — I-STAT BETA HCG BLOOD, ED (MC, WL, AP ONLY): I-stat hCG, quantitative: <5 m[IU]/mL (ref <5)

## 2020-08-29 LAB — CBG MONITORING, ED: Glucose-Capillary: 122 mg/dL — ABNORMAL HIGH (ref 70–99)

## 2020-08-29 MED ORDER — LIDOCAINE HCL (CARDIAC) PF 100 MG/5ML IV SOSY
PREFILLED_SYRINGE | INTRAVENOUS | Status: AC
  Filled 2020-08-29: qty 5

## 2020-08-29 MED ORDER — FENTANYL 2500MCG IN NS 250ML (10MCG/ML) PREMIX INFUSION
0.0000 ug/h | INTRAVENOUS | Status: DC
Start: 2020-08-30 — End: 2020-08-31
  Administered 2020-08-30: 25 ug/h via INTRAVENOUS

## 2020-08-29 MED ORDER — ETOMIDATE 2 MG/ML IV SOLN
INTRAVENOUS | Status: AC
  Administered 2020-08-29: 20 mg
  Filled 2020-08-29: qty 20

## 2020-08-29 MED ORDER — SODIUM CHLORIDE 0.9 % IV SOLN: INTRAVENOUS | Status: DC

## 2020-08-29 MED ORDER — ROCURONIUM BROMIDE 10 MG/ML (PF) SYRINGE
PREFILLED_SYRINGE | INTRAVENOUS | Status: AC
  Administered 2020-08-29: 80 mg
  Filled 2020-08-29: qty 10

## 2020-08-29 MED ORDER — MIDAZOLAM HCL 2 MG/2ML IJ SOLN
INTRAMUSCULAR | Status: AC
  Filled 2020-08-29: qty 2

## 2020-08-29 MED ORDER — FENTANYL CITRATE (PF) 100 MCG/2ML IJ SOLN
INTRAMUSCULAR | Status: AC
  Filled 2020-08-29: qty 2

## 2020-08-29 MED ORDER — NALOXONE HCL 2 MG/2ML IJ SOSY
PREFILLED_SYRINGE | INTRAMUSCULAR | Status: AC
  Administered 2020-08-29: 2 mg via INTRAVENOUS
  Filled 2020-08-29: qty 2

## 2020-08-29 MED ORDER — SODIUM CHLORIDE 0.9 % IV BOLUS
1000.0000 mL | Freq: Once | INTRAVENOUS | Status: AC
  Administered 2020-08-29: 1000 mL via INTRAVENOUS

## 2020-08-29 MED ORDER — SUCCINYLCHOLINE CHLORIDE 200 MG/10ML IV SOSY
PREFILLED_SYRINGE | INTRAVENOUS | Status: AC
  Filled 2020-08-29: qty 10

## 2020-08-29 MED ORDER — NALOXONE HCL 2 MG/2ML IJ SOSY: 2.0000 mg | PREFILLED_SYRINGE | Freq: Once | INTRAMUSCULAR | Status: AC

## 2020-08-29 MED ORDER — MIDAZOLAM 50MG/50ML (1MG/ML) PREMIX INFUSION
0.5000 mg/h | INTRAVENOUS | Status: DC
  Administered 2020-08-30: 2 mg/h via INTRAVENOUS
  Administered 2020-08-30: 0.5 mg/h via INTRAVENOUS
  Filled 2020-08-29: qty 50

## 2020-08-29 MED ORDER — STERILE WATER FOR INJECTION IJ SOLN
INTRAMUSCULAR | Status: AC
  Filled 2020-08-29: qty 10

## 2020-08-29 MED ORDER — VECURONIUM BROMIDE 10 MG IV SOLR
INTRAVENOUS | Status: AC
  Filled 2020-08-29: qty 10

## 2020-08-29 MED ORDER — PROPOFOL 500 MG/50ML IV EMUL
INTRAVENOUS | Status: AC
  Filled 2020-08-29: qty 50

## 2020-08-29 NOTE — ED Triage Notes (Signed)
Pt BIB friend who reports that patient took an overdose of unisom, benadryl, and trazadone. Unsure of exact amounts, but reports "a lot." Responsive to pain only.

## 2020-08-29 NOTE — ED Notes (Addendum)
Spoke with Lennette Bihari from poison control: He recommends to use IV fluids and add a magnesium level. If her QTc >500, we should maximize K+ and Mg levels. Also recommends to repeat tylenol and salicylate in 4 hours. Otherwise, supportive care is recommended.

## 2020-08-30 ENCOUNTER — Encounter (HOSPITAL_COMMUNITY): Payer: Self-pay | Admitting: Emergency Medicine

## 2020-08-30 DIAGNOSIS — J9601 Acute respiratory failure with hypoxia: Secondary | ICD-10-CM | POA: Diagnosis present

## 2020-08-30 DIAGNOSIS — T450X2A Poisoning by antiallergic and antiemetic drugs, intentional self-harm, initial encounter: Secondary | ICD-10-CM | POA: Diagnosis present

## 2020-08-30 DIAGNOSIS — F419 Anxiety disorder, unspecified: Secondary | ICD-10-CM | POA: Diagnosis present

## 2020-08-30 DIAGNOSIS — Z880 Allergy status to penicillin: Secondary | ICD-10-CM | POA: Diagnosis not present

## 2020-08-30 DIAGNOSIS — Z79899 Other long term (current) drug therapy: Secondary | ICD-10-CM | POA: Diagnosis not present

## 2020-08-30 DIAGNOSIS — Z9151 Personal history of suicidal behavior: Secondary | ICD-10-CM | POA: Diagnosis not present

## 2020-08-30 DIAGNOSIS — K7010 Alcoholic hepatitis without ascites: Secondary | ICD-10-CM | POA: Diagnosis present

## 2020-08-30 DIAGNOSIS — R569 Unspecified convulsions: Secondary | ICD-10-CM | POA: Diagnosis present

## 2020-08-30 DIAGNOSIS — T50902A Poisoning by unspecified drugs, medicaments and biological substances, intentional self-harm, initial encounter: Secondary | ICD-10-CM | POA: Diagnosis present

## 2020-08-30 DIAGNOSIS — Z9152 Personal history of nonsuicidal self-harm: Secondary | ICD-10-CM | POA: Diagnosis not present

## 2020-08-30 DIAGNOSIS — D485 Neoplasm of uncertain behavior of skin: Secondary | ICD-10-CM | POA: Diagnosis present

## 2020-08-30 DIAGNOSIS — T50904A Poisoning by unspecified drugs, medicaments and biological substances, undetermined, initial encounter: Secondary | ICD-10-CM

## 2020-08-30 DIAGNOSIS — F17200 Nicotine dependence, unspecified, uncomplicated: Secondary | ICD-10-CM | POA: Diagnosis present

## 2020-08-30 DIAGNOSIS — G4700 Insomnia, unspecified: Secondary | ICD-10-CM | POA: Diagnosis present

## 2020-08-30 DIAGNOSIS — Z20822 Contact with and (suspected) exposure to covid-19: Secondary | ICD-10-CM | POA: Diagnosis present

## 2020-08-30 DIAGNOSIS — R001 Bradycardia, unspecified: Secondary | ICD-10-CM | POA: Diagnosis present

## 2020-08-30 DIAGNOSIS — T43212A Poisoning by selective serotonin and norepinephrine reuptake inhibitors, intentional self-harm, initial encounter: Secondary | ICD-10-CM | POA: Diagnosis present

## 2020-08-30 DIAGNOSIS — E872 Acidosis: Secondary | ICD-10-CM | POA: Diagnosis present

## 2020-08-30 DIAGNOSIS — F1010 Alcohol abuse, uncomplicated: Secondary | ICD-10-CM | POA: Diagnosis present

## 2020-08-30 DIAGNOSIS — T50901A Poisoning by unspecified drugs, medicaments and biological substances, accidental (unintentional), initial encounter: Secondary | ICD-10-CM | POA: Diagnosis present

## 2020-08-30 DIAGNOSIS — G928 Other toxic encephalopathy: Secondary | ICD-10-CM | POA: Diagnosis present

## 2020-08-30 DIAGNOSIS — F3130 Bipolar disorder, current episode depressed, mild or moderate severity, unspecified: Secondary | ICD-10-CM | POA: Diagnosis present

## 2020-08-30 LAB — CBC
HCT: 40.3 % (ref 36.0–46.0)
Hemoglobin: 13.6 g/dL (ref 12.0–15.0)
MCH: 36 pg — ABNORMAL HIGH (ref 26.0–34.0)
MCHC: 33.7 g/dL (ref 30.0–36.0)
MCV: 106.6 fL — ABNORMAL HIGH (ref 80.0–100.0)
Platelets: 206 10*3/uL (ref 150–400)
RBC: 3.78 MIL/uL — ABNORMAL LOW (ref 3.87–5.11)
RDW: 12.3 % (ref 11.5–15.5)
WBC: 8.1 10*3/uL (ref 4.0–10.5)
nRBC: 0 % (ref 0.0–0.2)

## 2020-08-30 LAB — BLOOD GAS, ARTERIAL
Acid-base deficit: 5.1 mmol/L — ABNORMAL HIGH (ref 0.0–2.0)
Bicarbonate: 20.3 mmol/L (ref 20.0–28.0)
FIO2: 100
O2 Saturation: 99.3 %
Patient temperature: 98.6
pCO2 arterial: 41 mmHg (ref 32.0–48.0)
pH, Arterial: 7.316 — ABNORMAL LOW (ref 7.350–7.450)
pO2, Arterial: 502 mmHg — ABNORMAL HIGH (ref 83.0–108.0)

## 2020-08-30 LAB — APTT: aPTT: 27 seconds (ref 24–36)

## 2020-08-30 LAB — LIPASE, BLOOD: Lipase: 29 U/L (ref 11–51)

## 2020-08-30 LAB — GLUCOSE, CAPILLARY
Glucose-Capillary: 74 mg/dL (ref 70–99)
Glucose-Capillary: 81 mg/dL (ref 70–99)
Glucose-Capillary: 99 mg/dL (ref 70–99)
Glucose-Capillary: 83 mg/dL (ref 70–99)
Glucose-Capillary: 115 mg/dL — ABNORMAL HIGH (ref 70–99)

## 2020-08-30 LAB — COMPREHENSIVE METABOLIC PANEL
ALT: 24 U/L (ref 0–44)
ALT: 33 U/L (ref 0–44)
AST: 20 U/L (ref 15–41)
AST: 31 U/L (ref 15–41)
Albumin: 2.4 g/dL — ABNORMAL LOW (ref 3.5–5.0)
Albumin: 3.5 g/dL (ref 3.5–5.0)
Alkaline Phosphatase: 32 U/L — ABNORMAL LOW (ref 38–126)
Alkaline Phosphatase: 48 U/L (ref 38–126)
Anion gap: 6 (ref 5–15)
Anion gap: 12 (ref 5–15)
BUN: 10 mg/dL (ref 6–20)
BUN: 13 mg/dL (ref 6–20)
CO2: 16 mmol/L — ABNORMAL LOW (ref 22–32)
CO2: 20 mmol/L — ABNORMAL LOW (ref 22–32)
Calcium: 5.6 mg/dL — CL (ref 8.9–10.3)
Calcium: 8.1 mg/dL — ABNORMAL LOW (ref 8.9–10.3)
Chloride: 119 mmol/L — ABNORMAL HIGH (ref 98–111)
Chloride: 108 mmol/L (ref 98–111)
Creatinine, Ser: 0.68 mg/dL (ref 0.44–1.00)
Creatinine, Ser: 0.87 mg/dL (ref 0.44–1.00)
GFR, Estimated: >60 mL/min (ref >60)
GFR, Estimated: >60 mL/min (ref >60)
Glucose, Bld: 83 mg/dL (ref 70–99)
Glucose, Bld: 97 mg/dL (ref 70–99)
Potassium: 2.2 mmol/L — CL (ref 3.5–5.1)
Potassium: 3.2 mmol/L — ABNORMAL LOW (ref 3.5–5.1)
Sodium: 141 mmol/L (ref 135–145)
Sodium: 140 mmol/L (ref 135–145)
Total Bilirubin: 0.4 mg/dL (ref 0.3–1.2)
Total Bilirubin: 0.6 mg/dL (ref 0.3–1.2)
Total Protein: 4.1 g/dL — ABNORMAL LOW (ref 6.5–8.1)
Total Protein: 5.9 g/dL — ABNORMAL LOW (ref 6.5–8.1)

## 2020-08-30 LAB — RAPID URINE DRUG SCREEN, HOSP PERFORMED
Amphetamines: NOT DETECTED
Barbiturates: NOT DETECTED
Benzodiazepines: NOT DETECTED
Cocaine: NOT DETECTED
Opiates: NOT DETECTED
Tetrahydrocannabinol: NOT DETECTED

## 2020-08-30 LAB — PROTIME-INR
INR: 1 (ref 0.8–1.2)
Prothrombin Time: 13 seconds (ref 11.4–15.2)

## 2020-08-30 LAB — I-STAT CHEM 8, ED
BUN: 13 mg/dL (ref 6–20)
Calcium, Ion: 0.97 mmol/L — ABNORMAL LOW (ref 1.15–1.40)
Chloride: 109 mmol/L (ref 98–111)
Creatinine, Ser: 1.2 mg/dL — ABNORMAL HIGH (ref 0.44–1.00)
Glucose, Bld: 97 mg/dL (ref 70–99)
HCT: 40 % (ref 36.0–46.0)
Hemoglobin: 13.6 g/dL (ref 12.0–15.0)
Potassium: 3.8 mmol/L (ref 3.5–5.1)
Sodium: 141 mmol/L (ref 135–145)
TCO2: 22 mmol/L (ref 22–32)

## 2020-08-30 LAB — BASIC METABOLIC PANEL
Anion gap: 10 (ref 5–15)
BUN: 13 mg/dL (ref 6–20)
CO2: 20 mmol/L — ABNORMAL LOW (ref 22–32)
Calcium: 8.1 mg/dL — ABNORMAL LOW (ref 8.9–10.3)
Chloride: 109 mmol/L (ref 98–111)
Creatinine, Ser: 0.85 mg/dL (ref 0.44–1.00)
GFR, Estimated: >60 mL/min (ref >60)
Glucose, Bld: 97 mg/dL (ref 70–99)
Potassium: 3.6 mmol/L (ref 3.5–5.1)
Sodium: 139 mmol/L (ref 135–145)

## 2020-08-30 LAB — RESP PANEL BY RT-PCR (FLU A&B, COVID) ARPGX2
Influenza A by PCR: NEGATIVE
Influenza B by PCR: NEGATIVE
SARS Coronavirus 2 by RT PCR: NEGATIVE

## 2020-08-30 LAB — URINALYSIS, ROUTINE W REFLEX MICROSCOPIC
Bacteria, UA: NONE SEEN
Bilirubin Urine: NEGATIVE
Glucose, UA: NEGATIVE mg/dL
Ketones, ur: NEGATIVE mg/dL
Leukocytes,Ua: NEGATIVE
Nitrite: NEGATIVE
Protein, ur: NEGATIVE mg/dL
Specific Gravity, Urine: 1.004 — ABNORMAL LOW (ref 1.005–1.030)
pH: 6 (ref 5.0–8.0)

## 2020-08-30 LAB — HIV ANTIBODY (ROUTINE TESTING W REFLEX): HIV Screen 4th Generation wRfx: NONREACTIVE

## 2020-08-30 LAB — LACTIC ACID, PLASMA: Lactic Acid, Venous: 2.5 mmol/L (ref 0.5–1.9)

## 2020-08-30 LAB — ACETAMINOPHEN LEVEL: Acetaminophen (Tylenol), Serum: <10 ug/mL — ABNORMAL LOW (ref 10–30)

## 2020-08-30 LAB — PHOSPHORUS: Phosphorus: 4 mg/dL (ref 2.5–4.6)

## 2020-08-30 LAB — ETHANOL: Alcohol, Ethyl (B): 238 mg/dL — ABNORMAL HIGH (ref <10)

## 2020-08-30 LAB — MRSA NEXT GEN BY PCR, NASAL: MRSA by PCR Next Gen: NOT DETECTED

## 2020-08-30 LAB — MAGNESIUM
Magnesium: 2 mg/dL (ref 1.7–2.4)
Magnesium: 2 mg/dL (ref 1.7–2.4)

## 2020-08-30 LAB — CK: Total CK: 143 U/L (ref 38–234)

## 2020-08-30 LAB — SALICYLATE LEVEL: Salicylate Lvl: <7 mg/dL — ABNORMAL LOW (ref 7.0–30.0)

## 2020-08-30 MED ORDER — CHARCOAL ACTIVATED PO LIQD
50.0000 g | Freq: Once | ORAL | Status: AC
  Administered 2020-08-30: 50 g via ORAL
  Filled 2020-08-30: qty 240

## 2020-08-30 MED ORDER — MIDAZOLAM 50MG/50ML (1MG/ML) PREMIX INFUSION: 0.5000 mg/h | INTRAVENOUS | Status: DC

## 2020-08-30 MED ORDER — CHLORHEXIDINE GLUCONATE CLOTH 2 % EX PADS
6.0000 | MEDICATED_PAD | Freq: Every day | CUTANEOUS | Status: DC
  Administered 2020-08-30 – 2020-09-01 (×3): 6 via TOPICAL

## 2020-08-30 MED ORDER — ORAL CARE MOUTH RINSE
15.0000 mL | Freq: Two times a day (BID) | OROMUCOSAL | Status: DC
  Administered 2020-08-31 – 2020-09-01 (×3): 15 mL via OROMUCOSAL

## 2020-08-30 MED ORDER — FENTANYL 2500MCG IN NS 250ML (10MCG/ML) PREMIX INFUSION: 0.0000 ug/h | INTRAVENOUS | Status: DC

## 2020-08-30 MED ORDER — DOCUSATE SODIUM 100 MG PO CAPS: 100.0000 mg | ORAL_CAPSULE | Freq: Two times a day (BID) | ORAL | Status: DC | PRN

## 2020-08-30 MED ORDER — FAMOTIDINE IN NACL 20-0.9 MG/50ML-% IV SOLN
20.0000 mg | INTRAVENOUS | Status: DC
  Administered 2020-08-30 – 2020-08-31 (×2): 20 mg via INTRAVENOUS
  Filled 2020-08-30 (×2): qty 50

## 2020-08-30 MED ORDER — ENOXAPARIN SODIUM 30 MG/0.3ML IJ SOSY
30.0000 mg | PREFILLED_SYRINGE | Freq: Two times a day (BID) | INTRAMUSCULAR | Status: DC
  Administered 2020-08-30: 30 mg via SUBCUTANEOUS
  Filled 2020-08-30: qty 0.3

## 2020-08-30 MED ORDER — ORAL CARE MOUTH RINSE
15.0000 mL | OROMUCOSAL | Status: DC
  Administered 2020-08-30: 15 mL via OROMUCOSAL

## 2020-08-30 MED ORDER — CHLORHEXIDINE GLUCONATE 0.12% ORAL RINSE (MEDLINE KIT)
15.0000 mL | Freq: Two times a day (BID) | OROMUCOSAL | Status: DC
  Administered 2020-08-30: 15 mL via OROMUCOSAL

## 2020-08-30 MED ORDER — ENOXAPARIN SODIUM 40 MG/0.4ML IJ SOSY
40.0000 mg | PREFILLED_SYRINGE | Freq: Every day | INTRAMUSCULAR | Status: DC
  Administered 2020-08-31 – 2020-09-01 (×2): 40 mg via SUBCUTANEOUS
  Filled 2020-08-30 (×2): qty 0.4

## 2020-08-30 MED ORDER — POLYETHYLENE GLYCOL 3350 17 G PO PACK: 17.0000 g | PACK | Freq: Every day | ORAL | Status: DC | PRN

## 2020-08-30 NOTE — Procedures (Signed)
Extubation Procedure Note  Patient Details:   Name: Brooke Mcbride DOB: 1970/04/13 MRN: [id number]   Airway Documentation:    Vent end date: 08/30/20 Vent end time: 1209   Evaluation  O2 sats: 98 Complications: none Patient did tolerate procedure well. Bilateral Breath Sounds: Clear   Able to speak  Martha Clan 08/30/2020, 12:12 PM

## 2020-08-30 NOTE — ED Notes (Signed)
Increase in fentanyl and versed drip necessary due to pt movement to make pt more comfortable.

## 2020-08-30 NOTE — Progress Notes (Signed)
Initial Nutrition Assessment  DOCUMENTATION CODES:   Not applicable  INTERVENTION:  - weigh patient today. - if she is unable to extubate in the next 24 hours, recommend Osmolite 1.2 @ 50 ml/hr with 45 ml Prosource TF BID to provide 1520 kcal, 89 grams protein, and 984 ml free water.    NUTRITION DIAGNOSIS:   Inadequate oral intake related to inability to eat as evidenced by NPO status.  GOAL:   Patient will meet greater than or equal to 90% of their needs  MONITOR:   Vent status, Labs, Weight trends  REASON FOR ASSESSMENT:   Ventilator  ASSESSMENT:   50 year old female with medical history of mood disorder (bipolar disorder) and insomnia. She presented to the ED after an intentional overdose. It is estimated that she took 10 pills each of benadryl, Unisom, and Trazodone. Husband reported that she drinks 3-5 drink/night and had ingested a significant amount the evening of OD.  Patient was intubated today around 0135 and OGT placed yesterday at ~2355. She remains intubated at this time. OGT to LIS with ~300 ml output of administered activated charcoal.   Patient is awake on the vent and able to nod/shake head and write to communicate. No family or visitors present.   RN at bedside and reports that patient will remain intubated for at least 24 hours for airway protection.  She has not been weighed since 03/04/20 at which time weight was exactly the same as on 02/12/20 (156 lb/70.8 kg).   Per notes: - suicide attempt by OD - will need psych consult after extubation - hx alcohol abuse with CIWA protocol in place   Patient is currently intubated on ventilator support MV: 8.6 L/min Temp (24hrs), Avg:97.3 F (36.3 C), Min:96.4 F (35.8 C), Max:99 F (37.2 C) Propofol: none BP: 91/60 and MAP: 67  Labs reviewed; CBG: 74 mg/dl, creatinine: 1.2 mg/dl, Ca: 8.1 mg/dl, ionized Ca: 0.97 mmol/l. Medications reviewed; 20 mg IV pepcid/day. Drips; fentanyl @ 100 mcg/hr, versed @ 2  mg/hr.      NUTRITION - FOCUSED PHYSICAL EXAM:  Completed; no muscle or fat depletions, no edema at this time.   Diet Order:   Diet Order             Diet NPO time specified  Diet effective now                   EDUCATION NEEDS:   No education needs have been identified at this time  Skin:  Skin Assessment: Reviewed RN Assessment  Last BM:  PTA/unknown  Height:   Ht Readings from Last 1 Encounters:  08/29/20 5\' 5"  (1.651 m)    Weight:   Wt Readings from Last 1 Encounters:  03/04/20 70.8 kg     Estimated Nutritional Needs:  Kcal:  1546 kcal Protein:  85-106 grams Fluid:  >/= 1.8 L/day       Jarome Matin, MS, RD, LDN, CNSC Inpatient Clinical Dietitian RD pager # available in [place]  After hours/weekend pager # available in [facility]

## 2020-08-30 NOTE — ED Notes (Signed)
Increasing sedation for pt comfort as she continues to be agitated and restless.  RN at the bedside.  Spouse at the bedside.

## 2020-08-30 NOTE — ED Notes (Signed)
Critical lab called, Lactic Acid 2.5
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
## 2020-08-30 NOTE — Progress Notes (Signed)
Patient moved to recess A under this RN care. Patient not responding to voice, pain. Disoriented x4. Eyes closed. Dr. Randal Buba verbal order for Etomidate, Rocuronium and plan to intubate patient due to patient unable to protect airway.   11:44 pm start of intubation 20mg  Etomidate in at 11:45pm 80mg  Rocuronium in at 11:47pm ET tube placed, lung sounds asculatated, Xray order in place. 11:50pm OG tube placed  Dr. Jannet Mantis, RN Dorian Pod, RN Respiratory therapy at bedside.

## 2020-08-30 NOTE — Plan of Care (Signed)
Patient is intubated and sedated.   Problem: Safety: Goal: Non-violent Restraint(s) Outcome: Not Progressing   Problem: Education: Goal: Knowledge of General Education information will improve Description: Including pain rating scale, medication(s)/side effects and non-pharmacologic comfort measures Outcome: Not Progressing   Problem: Clinical Measurements: Goal: Respiratory complications will improve Outcome: Progressing   Problem: Nutrition: Goal: Adequate nutrition will be maintained Outcome: Not Progressing   Problem: Coping: Goal: Level of anxiety will decrease Outcome: Not Progressing   Problem: Pain Managment: Goal: General experience of comfort will improve Outcome: Not Progressing
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
## 2020-08-30 NOTE — Progress Notes (Signed)
Pt transported with the RN from ED to 2W rm 1231 on the vent without any complications. Vent plugged in the red outlet.

## 2020-08-30 NOTE — ED Notes (Signed)
Poison Control recommends 1g/kg of charcoal without sorbitol. Pal[place] MD made aware.

## 2020-08-30 NOTE — ED Provider Notes (Signed)
[facility] DEPT Provider Note   CSN: [id number] Arrival date & time: 08/29/20  2258     History Chief Complaint  Patient presents with   Drug Overdose    Brooke Mcbride is a 50 y.o. female.  The history is provided by a relative and medical records. The history is limited by the condition of the patient.  Drug Overdose This is a new problem. The current episode started less than 1 hour ago. The problem occurs constantly. The problem has not changed since onset.Nothing aggravates the symptoms. Nothing relieves the symptoms. She has tried nothing for the symptoms. The treatment provided no relief.  Patient with Bipolar disorder and alcohol abuse presents after overdose of Benadryl, Unisom, and trazedone and concominant ETOH.      History reviewed. No pertinent past medical history.  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 No pertinent surgical history.   OB History   No obstetric history on file.     History reviewed. No pertinent family history.  Social History   Tobacco Use   Smoking status: Every Day    Pack years: 0.00   Smokeless tobacco: Never    Home Medications Prior to Admission medications   Medication Sig Start Date End Date Taking? Authorizing Provider  buPROPion (WELLBUTRIN XL) 300 MG 24 hr  tablet TAKE 1 TABLET BY MOUTH EVERY DAY IN THE MORNING 07/09/19  Yes Plotnikov, Evie Lacks, MD  traZODone (DESYREL) 50 MG tablet TAKE 1 TABLET BY MOUTH EVERYDAY AT BEDTIME 04/09/20  Yes Plotnikov, Evie Lacks, MD  B Complex-Folic Acid (B COMPLEX PLUS) TABS Take 1 tablet by mouth daily. 03/04/20   Plotnikov, Evie Lacks, MD  Cholecalciferol (VITAMIN D3) 50 MCG (2000 UT) capsule Take 1 capsule (2,000 Units total) by mouth daily. 03/04/20   Plotnikov, Evie Lacks, MD  eszopiclone (LUNESTA) 1 MG TABS tablet Take 0.5-1 tablets (0.5-1 mg total) by mouth at bedtime as needed for sleep. Take immediately before bedtime 03/04/20   Plotnikov, Evie Lacks, MD  lamoTRIgine (LAMICTAL) 150 MG tablet TAKE 1 TABLET BY MOUTH EVERY DAY 04/03/20   Plotnikov, Evie Lacks, MD  naltrexone (DEPADE) 50 MG tablet TAKE 1 TABLET BY MOUTH EVERY DAY 04/16/20   Hoyt Koch, MD    Allergies    Erythromycin and Penicillins  Review of Systems   Review of Systems  Unable to perform ROS: Acuity of condition  Constitutional:  Negative for fever.  HENT:  Negative for facial swelling.   Eyes:  Negative for redness.  Respiratory:  Negative for wheezing and stridor.   Cardiovascular:  Negative for leg swelling.  Skin:  Negative for rash.  Neurological:  Negative for facial asymmetry.   Physical Exam Updated Vital Signs BP (!) 154/92   Pulse 79   Temp (!) 96.7 F (35.9 C)   Resp 18   Ht  5\' 5"  (1.651 m)   LMP  (LMP Unknown)   SpO2 100%   BMI 25.96 kg/m   Physical Exam Vitals and nursing note reviewed. Exam conducted with a chaperone present.  Constitutional:      Appearance: She is not diaphoretic.  HENT:     Head: Normocephalic and atraumatic.     Right Ear: Tympanic membrane normal.     Left Ear: Tympanic membrane normal.     Nose: Nose normal.     Mouth/Throat:     Mouth: Mucous membranes are moist.     Pharynx: Oropharynx is clear.  Eyes:     Conjunctiva/sclera: Conjunctivae normal.     Pupils: Pupils are equal,  round, and reactive to light.  Cardiovascular:     Rate and Rhythm: Regular rhythm. Tachycardia present.     Pulses: Normal pulses.     Heart sounds: Normal heart sounds.  Pulmonary:     Effort: Pulmonary effort is normal.     Breath sounds: Normal breath sounds. No wheezing or rales.  Abdominal:     General: Abdomen is flat. Bowel sounds are normal.     Palpations: Abdomen is soft.     Tenderness: There is no abdominal tenderness. There is no guarding.  Musculoskeletal:     Cervical back: Normal range of motion and neck supple. No rigidity.     Right lower leg: No edema.     Left lower leg: No edema.  Lymphadenopathy:     Cervical: No cervical adenopathy.  Skin:    General: Skin is warm and dry.     Capillary Refill: Capillary refill takes less than 2 seconds.  Neurological:     GCS: GCS eye subscore is 2. GCS verbal subscore is 2. GCS motor subscore is 5.     Deep Tendon Reflexes: Reflexes normal.    ED Results / Procedures / Treatments   Labs (all labs ordered are listed, but only abnormal results are displayed) Results for orders placed or performed during the hospital encounter of 08/29/20  Resp Panel by RT-PCR (Flu A&B, Covid) Nasopharyngeal Swab   Specimen: Nasopharyngeal Swab; Nasopharyngeal(NP) swabs in vial transport medium  Result Value Ref Range   SARS Coronavirus 2 by RT PCR NEGATIVE NEGATIVE   Influenza A by PCR NEGATIVE NEGATIVE   Influenza B by PCR NEGATIVE NEGATIVE  Comprehensive metabolic panel  Result Value Ref Range   Sodium 141 135 - 145 mmol/L   Potassium 2.2 (LL) 3.5 - 5.1 mmol/L   Chloride 119 (H) 98 - 111 mmol/L   CO2 16 (L) 22 - 32 mmol/L   Glucose, Bld 83 70 - 99 mg/dL   BUN 10 6 - 20 mg/dL   Creatinine, Ser 0.68 0.44 - 1.00 mg/dL   Calcium 5.6 (LL) 8.9 - 10.3 mg/dL   Total Protein 4.1 (L) 6.5 - 8.1 g/dL   Albumin 2.4 (L) 3.5 - 5.0 g/dL   AST 20 15 - 41 U/L   ALT 24 0 - 44 U/L   Alkaline Phosphatase 32 (L) 38 - 126 U/L   Total Bilirubin  0.4 0.3 - 1.2 mg/dL   GFR, Estimated >60 >60 mL/min   Anion gap 6 5 - 15  Salicylate level  Result Value Ref Range   Salicylate Lvl <5.1 (L) 7.0 - 30.0 mg/dL  Acetaminophen level  Result Value Ref Range   Acetaminophen (Tylenol), Serum <10 (L) 10 - 30 ug/mL  Ethanol  Result Value Ref Range   Alcohol, Ethyl (B)  238 (H) <10 mg/dL  Urine rapid drug screen (hosp performed)  Result Value Ref Range   Opiates NONE DETECTED NONE DETECTED   Cocaine NONE DETECTED NONE DETECTED   Benzodiazepines NONE DETECTED NONE DETECTED   Amphetamines NONE DETECTED NONE DETECTED   Tetrahydrocannabinol NONE DETECTED NONE DETECTED   Barbiturates NONE DETECTED NONE DETECTED  CBC WITH DIFFERENTIAL  Result Value Ref Range   WBC 10.5 4.0 - 10.5 K/uL   RBC 3.65 (L) 3.87 - 5.11 MIL/uL   Hemoglobin 13.0 12.0 - 15.0 g/dL   HCT 38.8 36.0 - 46.0 %   MCV 106.3 (H) 80.0 - 100.0 fL   MCH 35.6 (H) 26.0 - 34.0 pg   MCHC 33.5 30.0 - 36.0 g/dL   RDW 12.2 11.5 - 15.5 %   Platelets 219 150 - 400 K/uL   nRBC 0.0 0.0 - 0.2 %   Neutrophils Relative % 47 %   Neutro Abs 4.9 1.7 - 7.7 K/uL   Lymphocytes Relative 42 %   Lymphs Abs 4.4 (H) 0.7 - 4.0 K/uL   Monocytes Relative 6 %   Monocytes Absolute 0.7 0.1 - 1.0 K/uL   Eosinophils Relative 4 %   Eosinophils Absolute 0.4 0.0 - 0.5 K/uL   Basophils Relative 1 %   Basophils Absolute 0.1 0.0 - 0.1 K/uL   Immature Granulocytes 0 %   Abs Immature Granulocytes 0.04 0.00 - 0.07 K/uL  Blood gas, arterial (at [facility])  Result Value Ref Range   FIO2 100.00    pH, Arterial 7.316 (L) 7.350 - 7.450   pCO2 arterial 41.0 32.0 - 48.0 mmHg   pO2, Arterial 502 (H) 83.0 - 108.0 mmHg   Bicarbonate 20.3 20.0 - 28.0 mmol/L   Acid-base deficit 5.1 (H) 0.0 - 2.0 mmol/L   O2 Saturation 99.3 %   Patient temperature 98.6    Allens test (pass/fail) PASS PASS  CBG monitoring, ED  Result Value Ref Range   Glucose-Capillary 122 (H) 70 - 99 mg/dL  I-Stat beta hCG blood, ED  Result Value Ref  Range   I-stat hCG, quantitative <5.0 <5 mIU/mL   Comment 3           DG Chest Portable 1 View  Result Date: 08/30/2020 CLINICAL DATA:  Overdose.  Post intubation. EXAM: PORTABLE CHEST 1 VIEW COMPARISON:  None. FINDINGS: An endotracheal tube has been placed with tip measuring about 1.1 cm above the carina. Enteric tube was placed. Tip is off the field of view but below the left hemidiaphragm. Shallow inspiration. Heart size and pulmonary vascularity are normal. Lungs are clear. No pleural effusions. No pneumothorax. Mediastinal contours appear intact. IMPRESSION: Appliances appear in satisfactory position.  Lungs are clear. Electronically Signed   By: Lucienne Capers M.D.   On: 08/30/2020 00:16    EKG EKG Interpretation  Date/Time:  Sunday August 29 2020 23:10:41 EDT Ventricular Rate:  86 PR Interval:  143 QRS Duration: 89 QT Interval:  403 QTC Calculation: 482 R Axis:   86 Text Interpretation: Sinus rhythm Probable left atrial enlargement Confirmed by Randal Buba, Alysiah Suppa ([id number]) on 08/30/2020 1:33:42 AM  Radiology DG Chest Portable 1 View  Result Date: 08/30/2020 CLINICAL DATA:  Overdose.  Post intubation. EXAM: PORTABLE CHEST 1 VIEW COMPARISON:  None. FINDINGS: An endotracheal tube has been placed with tip measuring about 1.1 cm above the carina. Enteric tube was placed. Tip is off the field of view but below the left hemidiaphragm. Shallow inspiration. Heart size and pulmonary vascularity are  normal. Lungs are clear. No pleural effusions. No pneumothorax. Mediastinal contours appear intact. IMPRESSION: Appliances appear in satisfactory position.  Lungs are clear. Electronically Signed   By: Lucienne Capers M.D.   On: 08/30/2020 00:16    Procedures Procedure Name: Intubation Date/Time: 08/30/2020 1:37 AM Performed by: Veatrice Kells, MD Pre-anesthesia Checklist: Patient identified, Patient being monitored, Emergency Drugs available, Timeout performed and Suction available Oxygen Delivery Method:  Non-rebreather mask Preoxygenation: Pre-oxygenation with 100% oxygen Induction Type: Rapid sequence Ventilation: Mask ventilation without difficulty Laryngoscope Size: Glidescope and 4 Grade View: Grade III Tube size: 7.5 mm Number of attempts: 1 Airway Equipment and Method: Patient positioned with wedge pillow Placement Confirmation: ETT inserted through vocal cords under direct vision, CO2 detector and Breath sounds checked- equal and bilateral Dental Injury: Teeth and Oropharynx as per pre-operative assessment  Difficulty Due To: Difficulty was anticipated and Difficult Airway- due to anterior larynx      Medications Ordered in ED Medications  sodium chloride 0.9 % bolus 1,000 mL (1,000 mLs Intravenous New Bag/Given 08/29/20 2310)    And  0.9 %  sodium chloride infusion ( Intravenous New Bag/Given 08/29/20 2357)  midazolam (VERSED) 50 mg/50 mL (1 mg/mL) premix infusion (6 mg/hr Intravenous Rate/Dose Change 08/30/20 0048)  fentaNYL 2531mcg in NS 26mL (96mcg/ml) infusion-PREMIX (350 mcg/hr Intravenous Rate/Dose Change 08/30/20 0044)  docusate sodium (COLACE) capsule 100 mg (has no administration in time range)  polyethylene glycol (MIRALAX / GLYCOLAX) packet 17 g (has no administration in time range)  enoxaparin (LOVENOX) injection 30 mg (has no administration in time range)  naloxone [facility]) injection 2 mg (2 mg Intravenous Given 08/29/20 2321)  rocuronium bromide 100 MG/10ML SOSY (80 mg  Given 08/29/20 2352)  etomidate (AMIDATE) 2 MG/ML injection (20 mg  Given 08/29/20 2350)  charcoal activated (NO SORBITOL) (ACTIDOSE-AQUA) suspension 50 g (50 g Oral Given 08/30/20 0036)    ED Course  I have reviewed the triage vital signs and the nursing notes.  Pertinent labs & imaging results that were available during my care of the patient were reviewed by me and considered in my medical decision making (see chart for details).   MDM Reviewed: previous chart, nursing note and vitals Interpretation:  labs, ECG and x-ray (ETT in good position normal creatinine) Total time providing critical care: 75-105 minutes (versed and fentanyl drips). This excludes time spent performing separately reportable procedures and services. Consults: critical care CRITICAL CARE Performed by: Darcey Demma K Rashena Dowling-Rasch Total critical care time: 90 minutes Critical care time was exclusive of separately billable procedures and treating other patients. Critical care was necessary to treat or prevent imminent or life-threatening deterioration. Critical care was time spent personally by me on the following activities: development of treatment plan with patient and/or surrogate as well as nursing, discussions with consultants, evaluation of patient's response to treatment, examination of patient, obtaining history from patient or surrogate, ordering and performing treatments and interventions, ordering and review of laboratory studies, ordering and review of radiographic studies, pulse oximetry and re-evaluation of patient's condition.    Hypokalemia resolved as first specimen was likely drawn off line with saline.  EKG wasn't consistent with hypokalemia nor hypocalcemia.    Admit to ICU will need psychiatry when stable.    Final Clinical Impression(s) / ED Diagnoses Final diagnoses:  Intentional overdose of drug in tablet form ([place])  Respiratory arrest ([place])    Rx / DC Orders ED Discharge Orders     None        Dontaye Hur,  Milyn Stapleton, MD 08/30/20 7897

## 2020-08-31 LAB — GLUCOSE, CAPILLARY
Glucose-Capillary: 102 mg/dL — ABNORMAL HIGH (ref 70–99)
Glucose-Capillary: 104 mg/dL — ABNORMAL HIGH (ref 70–99)
Glucose-Capillary: 106 mg/dL — ABNORMAL HIGH (ref 70–99)
Glucose-Capillary: 120 mg/dL — ABNORMAL HIGH (ref 70–99)
Glucose-Capillary: 86 mg/dL (ref 70–99)

## 2020-08-31 LAB — BASIC METABOLIC PANEL
Anion gap: 6 (ref 5–15)
BUN: 8 mg/dL (ref 6–20)
CO2: 24 mmol/L (ref 22–32)
Calcium: 8.1 mg/dL — ABNORMAL LOW (ref 8.9–10.3)
Chloride: 108 mmol/L (ref 98–111)
Creatinine, Ser: 0.87 mg/dL (ref 0.44–1.00)
GFR, Estimated: >60 mL/min (ref >60)
Glucose, Bld: 102 mg/dL — ABNORMAL HIGH (ref 70–99)
Potassium: 3.4 mmol/L — ABNORMAL LOW (ref 3.5–5.1)
Sodium: 138 mmol/L (ref 135–145)

## 2020-08-31 LAB — LACTIC ACID, PLASMA: Lactic Acid, Venous: 1 mmol/L (ref 0.5–1.9)

## 2020-08-31 MED ORDER — POTASSIUM CHLORIDE 20 MEQ PO PACK
40.0000 meq | PACK | Freq: Two times a day (BID) | ORAL | Status: DC
  Administered 2020-08-31 – 2020-09-01 (×3): 40 meq via ORAL
  Filled 2020-08-31 (×3): qty 2

## 2020-08-31 MED ORDER — ACETAMINOPHEN 500 MG PO TABS: 500.0000 mg | ORAL_TABLET | Freq: Four times a day (QID) | ORAL | Status: DC | PRN

## 2020-08-31 MED ORDER — ACETAMINOPHEN 325 MG PO TABS
650.0000 mg | ORAL_TABLET | Freq: Four times a day (QID) | ORAL | Status: DC | PRN
  Administered 2020-08-31: 650 mg via ORAL
  Filled 2020-08-31: qty 2

## 2020-08-31 MED ORDER — LABETALOL HCL 5 MG/ML IV SOLN
10.0000 mg | INTRAVENOUS | Status: DC | PRN
  Administered 2020-08-31 – 2020-09-01 (×2): 10 mg via INTRAVENOUS
  Filled 2020-08-31 (×2): qty 4

## 2020-08-31 MED ORDER — FAMOTIDINE 20 MG PO TABS
20.0000 mg | ORAL_TABLET | Freq: Every day | ORAL | Status: DC
  Administered 2020-09-01: 20 mg via ORAL
  Filled 2020-08-31: qty 1

## 2020-08-31 MED ORDER — LORAZEPAM 2 MG/ML IJ SOLN
1.0000 mg | INTRAMUSCULAR | Status: DC | PRN
  Administered 2020-08-31: 2 mg via INTRAVENOUS
  Administered 2020-09-01: 1 mg via INTRAVENOUS
  Filled 2020-08-31 (×2): qty 1

## 2020-08-31 MED ORDER — LORAZEPAM 1 MG PO TABS
1.0000 mg | ORAL_TABLET | ORAL | Status: DC | PRN
Start: 2020-08-31 — End: 2020-09-01
  Administered 2020-08-31: 2 mg via ORAL
  Administered 2020-09-01: 1 mg via ORAL
  Filled 2020-08-31: qty 2
  Filled 2020-08-31: qty 1
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
## 2020-08-31 NOTE — Progress Notes (Signed)
Notified CCM provider of patient reported anxiety and rising/elevated BP, sitting chair, husband at bedside, pt reports she is "anxious because she has to stay another night"; see new PRN orders.
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
## 2020-09-01 ENCOUNTER — Encounter (HOSPITAL_COMMUNITY): Payer: Self-pay | Admitting: Family

## 2020-09-01 ENCOUNTER — Encounter (HOSPITAL_COMMUNITY): Payer: Self-pay | Admitting: Pulmonary Disease

## 2020-09-01 ENCOUNTER — Inpatient Hospital Stay (HOSPITAL_COMMUNITY)
Admission: AD | Admit: 2020-09-01 | Discharge: 2020-09-05 | DRG: 885 | Disposition: A | Discharge status: Home / Self Care | Payer: 59 | Source: Intra-hospital | Attending: Psychiatry | Admitting: Psychiatry

## 2020-09-01 DIAGNOSIS — T450X2A Poisoning by antiallergic and antiemetic drugs, intentional self-harm, initial encounter: Secondary | ICD-10-CM | POA: Diagnosis present

## 2020-09-01 DIAGNOSIS — F332 Major depressive disorder, recurrent severe without psychotic features: Secondary | ICD-10-CM | POA: Diagnosis present

## 2020-09-01 DIAGNOSIS — F314 Bipolar disorder, current episode depressed, severe, without psychotic features: Secondary | ICD-10-CM | POA: Diagnosis present

## 2020-09-01 DIAGNOSIS — Z79899 Other long term (current) drug therapy: Secondary | ICD-10-CM | POA: Diagnosis not present

## 2020-09-01 DIAGNOSIS — I10 Essential (primary) hypertension: Secondary | ICD-10-CM | POA: Diagnosis present

## 2020-09-01 DIAGNOSIS — Y907 Blood alcohol level of 200-239 mg/100 ml: Secondary | ICD-10-CM | POA: Diagnosis present

## 2020-09-01 DIAGNOSIS — F3170 Bipolar disorder, currently in remission, most recent episode unspecified: Secondary | ICD-10-CM | POA: Diagnosis not present

## 2020-09-01 DIAGNOSIS — T43212A Poisoning by selective serotonin and norepinephrine reuptake inhibitors, intentional self-harm, initial encounter: Secondary | ICD-10-CM | POA: Diagnosis present

## 2020-09-01 DIAGNOSIS — F17210 Nicotine dependence, cigarettes, uncomplicated: Secondary | ICD-10-CM | POA: Diagnosis present

## 2020-09-01 DIAGNOSIS — Z720 Tobacco use: Secondary | ICD-10-CM | POA: Diagnosis present

## 2020-09-01 DIAGNOSIS — K219 Gastro-esophageal reflux disease without esophagitis: Secondary | ICD-10-CM | POA: Diagnosis present

## 2020-09-01 DIAGNOSIS — Z20822 Contact with and (suspected) exposure to covid-19: Secondary | ICD-10-CM | POA: Diagnosis present

## 2020-09-01 DIAGNOSIS — F1010 Alcohol abuse, uncomplicated: Secondary | ICD-10-CM | POA: Diagnosis present

## 2020-09-01 DIAGNOSIS — E876 Hypokalemia: Secondary | ICD-10-CM | POA: Diagnosis present

## 2020-09-01 DIAGNOSIS — F1011 Alcohol abuse, in remission: Secondary | ICD-10-CM | POA: Diagnosis present

## 2020-09-01 LAB — BASIC METABOLIC PANEL
Anion gap: 8 (ref 5–15)
BUN: 6 mg/dL (ref 6–20)
CO2: 20 mmol/L — ABNORMAL LOW (ref 22–32)
Calcium: 8.3 mg/dL — ABNORMAL LOW (ref 8.9–10.3)
Chloride: 108 mmol/L (ref 98–111)
Creatinine, Ser: 0.77 mg/dL (ref 0.44–1.00)
GFR, Estimated: >60 mL/min (ref >60)
Glucose, Bld: 91 mg/dL (ref 70–99)
Potassium: 4.2 mmol/L (ref 3.5–5.1)
Sodium: 136 mmol/L (ref 135–145)

## 2020-09-01 LAB — GLUCOSE, CAPILLARY
Glucose-Capillary: 94 mg/dL (ref 70–99)
Glucose-Capillary: 97 mg/dL (ref 70–99)
Glucose-Capillary: 87 mg/dL (ref 70–99)
Glucose-Capillary: 116 mg/dL — ABNORMAL HIGH (ref 70–99)

## 2020-09-01 LAB — CBC WITH DIFFERENTIAL/PLATELET
Abs Immature Granulocytes: 0.03 10*3/uL (ref 0.00–0.07)
Basophils Absolute: 0.1 10*3/uL (ref 0.0–0.1)
Basophils Relative: 1 %
Eosinophils Absolute: 0.4 10*3/uL (ref 0.0–0.5)
Eosinophils Relative: 4 %
HCT: 37.4 % (ref 36.0–46.0)
Hemoglobin: 12.6 g/dL (ref 12.0–15.0)
Immature Granulocytes: 0 %
Lymphocytes Relative: 28 %
Lymphs Abs: 2.7 10*3/uL (ref 0.7–4.0)
MCH: 35.8 pg — ABNORMAL HIGH (ref 26.0–34.0)
MCHC: 33.7 g/dL (ref 30.0–36.0)
MCV: 106.3 fL — ABNORMAL HIGH (ref 80.0–100.0)
Monocytes Absolute: 0.7 10*3/uL (ref 0.1–1.0)
Monocytes Relative: 7 %
Neutro Abs: 5.7 10*3/uL (ref 1.7–7.7)
Neutrophils Relative %: 60 %
Platelets: 190 10*3/uL (ref 150–400)
RBC: 3.52 MIL/uL — ABNORMAL LOW (ref 3.87–5.11)
RDW: 12.2 % (ref 11.5–15.5)
WBC: 9.5 10*3/uL (ref 4.0–10.5)
nRBC: 0 % (ref 0.0–0.2)

## 2020-09-01 LAB — MAGNESIUM: Magnesium: 1.8 mg/dL (ref 1.7–2.4)

## 2020-09-01 LAB — RESP PANEL BY RT-PCR (FLU A&B, COVID) ARPGX2
Influenza A by PCR: NEGATIVE
Influenza B by PCR: NEGATIVE
SARS Coronavirus 2 by RT PCR: NEGATIVE

## 2020-09-01 MED ORDER — LORAZEPAM 1 MG PO TABS: 1.0000 mg | ORAL_TABLET | Freq: Four times a day (QID) | ORAL | Status: AC | PRN

## 2020-09-01 MED ORDER — LAMOTRIGINE 25 MG PO TABS
150.0000 mg | ORAL_TABLET | Freq: Every day | ORAL | Status: DC
  Administered 2020-09-01: 150 mg via ORAL
  Filled 2020-09-01: qty 2

## 2020-09-01 MED ORDER — LORAZEPAM 1 MG PO TABS
1.0000 mg | ORAL_TABLET | Freq: Two times a day (BID) | ORAL | Status: AC
  Administered 2020-09-04 – 2020-09-05 (×2): 1 mg via ORAL
  Filled 2020-09-01 (×2): qty 1

## 2020-09-01 MED ORDER — POLYETHYLENE GLYCOL 3350 17 G PO PACK: 17.0000 g | PACK | Freq: Every day | ORAL | Status: DC | PRN

## 2020-09-01 MED ORDER — LAMOTRIGINE 150 MG PO TABS: 150.0000 mg | ORAL_TABLET | Freq: Every day | ORAL | Status: DC

## 2020-09-01 MED ORDER — BUPROPION HCL ER (XL) 300 MG PO TB24
300.0000 mg | ORAL_TABLET | Freq: Every day | ORAL | Status: DC
  Administered 2020-09-02: 300 mg via ORAL
  Filled 2020-09-01 (×3): qty 1

## 2020-09-01 MED ORDER — LORAZEPAM 2 MG/ML IJ SOLN: 1.0000 mg | INTRAMUSCULAR | Status: DC | PRN

## 2020-09-01 MED ORDER — POTASSIUM CHLORIDE CRYS ER 20 MEQ PO TBCR
EXTENDED_RELEASE_TABLET | ORAL | Status: AC
  Administered 2020-09-01: 40 meq
  Filled 2020-09-01: qty 2

## 2020-09-01 MED ORDER — BUPROPION HCL ER (XL) 300 MG PO TB24
300.0000 mg | ORAL_TABLET | Freq: Every day | ORAL | Status: DC
  Administered 2020-09-01: 300 mg via ORAL
  Filled 2020-09-01: qty 1

## 2020-09-01 MED ORDER — LAMOTRIGINE 150 MG PO TABS
150.0000 mg | ORAL_TABLET | Freq: Every day | ORAL | Status: DC
  Administered 2020-09-02: 150 mg via ORAL
  Filled 2020-09-01 (×3): qty 1

## 2020-09-01 MED ORDER — ALUM & MAG HYDROXIDE-SIMETH 200-200-20 MG/5ML PO SUSP: 30.0000 mL | ORAL | Status: DC | PRN

## 2020-09-01 MED ORDER — ONDANSETRON 4 MG PO TBDP: 4.0000 mg | ORAL_TABLET | Freq: Four times a day (QID) | ORAL | Status: AC | PRN

## 2020-09-01 MED ORDER — LORAZEPAM 1 MG PO TABS
1.0000 mg | ORAL_TABLET | Freq: Three times a day (TID) | ORAL | Status: AC
  Administered 2020-09-03 – 2020-09-04 (×3): 1 mg via ORAL
  Filled 2020-09-01 (×3): qty 1

## 2020-09-01 MED ORDER — HYDROXYZINE HCL 25 MG PO TABS
25.0000 mg | ORAL_TABLET | Freq: Four times a day (QID) | ORAL | Status: AC | PRN
  Administered 2020-09-03 – 2020-09-04 (×3): 25 mg via ORAL
  Filled 2020-09-01 (×3): qty 1

## 2020-09-01 MED ORDER — BUPROPION HCL ER (XL) 300 MG PO TB24: 300.0000 mg | ORAL_TABLET | Freq: Every day | ORAL | Status: DC

## 2020-09-01 MED ORDER — FAMOTIDINE 20 MG PO TABS
20.0000 mg | ORAL_TABLET | Freq: Every day | ORAL | Status: DC
  Administered 2020-09-02 – 2020-09-05 (×4): 20 mg via ORAL
  Filled 2020-09-01 (×6): qty 1

## 2020-09-01 MED ORDER — DOCUSATE SODIUM 100 MG PO CAPS: 100.0000 mg | ORAL_CAPSULE | Freq: Two times a day (BID) | ORAL | Status: DC | PRN

## 2020-09-01 MED ORDER — ACETAMINOPHEN 325 MG PO TABS: 650.0000 mg | ORAL_TABLET | Freq: Four times a day (QID) | ORAL | Status: DC | PRN

## 2020-09-01 MED ORDER — POTASSIUM CHLORIDE 20 MEQ PO PACK
40.0000 meq | PACK | Freq: Two times a day (BID) | ORAL | Status: DC
  Filled 2020-09-01 (×4): qty 2

## 2020-09-01 MED ORDER — LORAZEPAM 1 MG PO TABS: 1.0000 mg | ORAL_TABLET | Freq: Every day | ORAL | Status: DC

## 2020-09-01 MED ORDER — MAGNESIUM HYDROXIDE 400 MG/5ML PO SUSP: 30.0000 mL | Freq: Every day | ORAL | Status: DC | PRN

## 2020-09-01 MED ORDER — LORAZEPAM 1 MG PO TABS
1.0000 mg | ORAL_TABLET | Freq: Four times a day (QID) | ORAL | Status: AC
  Administered 2020-09-01 – 2020-09-03 (×6): 1 mg via ORAL
  Filled 2020-09-01 (×6): qty 1

## 2020-09-01 MED ORDER — LORAZEPAM 1 MG PO TABS: 1.0000 mg | ORAL_TABLET | ORAL | Status: DC | PRN

## 2020-09-01 MED ORDER — TRAZODONE HCL 50 MG PO TABS: 50.0000 mg | ORAL_TABLET | Freq: Every evening | ORAL | Status: DC | PRN

## 2020-09-01 MED ORDER — LOPERAMIDE HCL 2 MG PO CAPS: 2.0000 mg | ORAL_CAPSULE | ORAL | Status: AC | PRN

## 2020-09-01 NOTE — TOC Transition Note (Addendum)
Transition of Care [facility]) - CM/SW Discharge Note   Patient Details  Name: Brooke Mcbride MRN: [id number] Date of Birth: 01/29/1971  [facility] CM/SW Contact:  Ross Ludwig, LCSW Phone Number: 09/01/2020, 3:37 PM   Clinical Narrative:     Patient will be going to [place] today as soon as the Covid results come back.  CSW spoke to patient she is agreeable and signed the voluntary admission  and consent form.  Patient will be going via [facility] transportation services.  CSW was informed by Carlis Abbott the [facility] they have a bed and can accept patient today.  She will be going to room 300-1, nurse to call report to [phone].  6:11pm  Patient now under IVC.  Faxed paperwork to [facility] office waiting for confirmation of paperwork received.  Patient will be served and transported by [employer].  7:35pm   Paperwork received from [employer] office, [employer] contacted to serve and transport to [facility].   Final next level of care: Psychiatric Hospital Barriers to Discharge: Barriers Resolved   Patient Goals and CMS Choice Patient states their goals for this hospitalization and ongoing recovery are:: To go to inpatient psych then return back home. CMS Medicare.gov Compare Post Acute Care list provided to:: Patient Choice offered to / list presented to : Patient  Discharge Placement                       Discharge Plan and Services   Discharge Planning Services: CM Consult                                 Social Determinants of Health (SDOH) Interventions     Readmission Risk Interventions No flowsheet data found.

## 2020-09-01 NOTE — Progress Notes (Signed)
Patient complains of not being able to smoke in hospital facility. She says that she is voluntary admitted and if she is not able to smoke cigarettes she will leave the hospital immediately. Nurse made aware and tried to deescalate patient. House supervisor made aware and spoke with patient as well. Will continue to have sitter at bedside to monitor patient.

## 2020-09-01 NOTE — Discharge Summary (Addendum)
Physician Discharge Summary  Brooke Mcbride WFU:[id number] DOB: 1970/03/20 DOA: 08/29/2020  PCP: Cassandria Anger, MD  Admit date: 08/29/2020 Discharge date: 09/01/2020  Discharge disposition: Inpatient psychiatry unit   Recommendations for Outpatient Follow-Up:   Outpatient follow-up with PCP and psychiatrist within 1 to 2 weeks after discharge from the hospital   Discharge Diagnosis:   Principal Problem:   Bipolar disorder (manic depression) ([place]) Active Problems:   Alcohol abuse   Overdose    Discharge Condition: Stable.  Diet recommendation:  Diet Order             Diet general           Diet regular Room service appropriate? Yes; Fluid consistency: Thin  Diet effective now                     Code Status: Full Code     Hospital Course:   Brooke Mcbride is a 50 year old woman with medical history significant for depression (denies history of bipolar disorder and said she was misdiagnosed), alcohol use disorder, insomnia, who was brought to the hospital because of suicidal attempt.  Reportedly, she took about 18 tablets of Benadryl, a handful of Unisom and a handful of trazodone.  She did this in an attempt to end her life after she got into an argument with her daughter and husband.  She was admitted to the hospital for intentional drug overdose and suicide attempt.  She was intubated and placed on mechanical ventilation in the emergency room to protect airway because of unresponsiveness/toxic metabolic encephalopathy.  She was extubated on the same day and was admitted to the critical care service.  She was transferred to the hospitalist service on 09/01/2020. She is medically stable.  She denies any suicidal ideation.  She is angry about not having control over her treatment.  She is deemed medically stable for discharge to behavioral health unit.  Discharge plan was discussed with Sheran Fava, NP with psychiatry team and patient's  husband.    Medical Consultants:   Psychiatrist   Discharge Exam:    Vitals:   09/01/20 0400 09/01/20 0628 09/01/20 0833 09/01/20 1215  BP: 133/68  (!) 156/83 (!) 152/83  Pulse: 73  75 77  Resp: 18  18 16   Temp: 98.7 F (37.1 C)  98.8 F (37.1 C) 97.9 F (36.6 C)  TempSrc: Oral  Oral Oral  SpO2: 98%  98% 97%  Weight:  73.5 kg    Height:         GEN: NAD SKIN: Warm and dry EYES: EOMI ENT: MMM CV: RRR PULM: CTA B ABD: soft, ND, NT, +BS CNS: AAO x 3, non focal EXT: No edema or tenderness PSYCH: Tearful and angry    Sitter at bedside during physical exam.   The results of significant diagnostics from this hospitalization (including imaging, microbiology, ancillary and laboratory) are listed below for reference.     Procedures and Diagnostic Studies:   DG Chest Portable 1 View  Result Date: 08/30/2020 CLINICAL DATA:  Overdose.  Post intubation. EXAM: PORTABLE CHEST 1 VIEW COMPARISON:  None. FINDINGS: An endotracheal tube has been placed with tip measuring about 1.1 cm above the carina. Enteric tube was placed. Tip is off the field of view but below the left hemidiaphragm. Shallow inspiration. Heart size and pulmonary vascularity are normal. Lungs are clear. No pleural effusions. No pneumothorax. Mediastinal contours appear intact. IMPRESSION: Appliances appear in satisfactory position.  Lungs are clear.  Electronically Signed   By: Lucienne Capers M.D.   On: 08/30/2020 00:16     Labs:   Basic Metabolic Panel: Recent Labs  Lab 08/29/20 2314 08/30/20 0054 08/30/20 0145 08/30/20 0202 08/31/20 1303 09/01/20 0349  NA 141 139 140 141 138 136  K 2.2* 3.6 3.2* 3.8 3.4* 4.2  CL 119* 109 108 109 108 108  CO2 16* 20* 20*  --  24 20*  GLUCOSE 83 97 97 97 102* 91  BUN 10 13 13 13 8 6   CREATININE 0.68 0.85 0.87 1.20* 0.87 0.77  CALCIUM 5.6* 8.1* 8.1*  --  8.1* 8.3*  MG  --  2.0 2.0  --   --  1.8  PHOS  --   --  4.0  --   --   --    GFR Estimated Creatinine  Clearance: 84.5 mL/min (by C-G formula based on SCr of 0.77 mg/dL). Liver Function Tests: Recent Labs  Lab 08/29/20 2314 08/30/20 0145  AST 20 31  ALT 24 33  ALKPHOS 32* 48  BILITOT 0.4 0.6  PROT 4.1* 5.9*  ALBUMIN 2.4* 3.5   Recent Labs  Lab 08/30/20 0145  LIPASE 29   No results for input(s): AMMONIA in the last 168 hours. Coagulation profile Recent Labs  Lab 08/30/20 0145  INR 1.0    CBC: Recent Labs  Lab 08/29/20 2314 08/30/20 0145 08/30/20 0202 09/01/20 0349  WBC 10.5 8.1  --  9.5  NEUTROABS 4.9  --   --  5.7  HGB 13.0 13.6 13.6 12.6  HCT 38.8 40.3 40.0 37.4  MCV 106.3* 106.6*  --  106.3*  PLT 219 206  --  190   Cardiac Enzymes: Recent Labs  Lab 08/30/20 0125  CKTOTAL 143   BNP: Invalid input(s): POCBNP CBG: Recent Labs  Lab 08/31/20 1925 08/31/20 2337 09/01/20 0348 09/01/20 0732 09/01/20 1153  GLUCAP 104* 106* 94 97 87   D-Dimer No results for input(s): DDIMER in the last 72 hours. Hgb A1c No results for input(s): HGBA1C in the last 72 hours. Lipid Profile No results for input(s): CHOL, HDL, LDLCALC, TRIG, CHOLHDL, LDLDIRECT in the last 72 hours. Thyroid function studies No results for input(s): TSH, T4TOTAL, T3FREE, THYROIDAB in the last 72 hours.  Invalid input(s): FREET3 Anemia work up No results for input(s): VITAMINB12, FOLATE, FERRITIN, TIBC, IRON, RETICCTPCT in the last 72 hours. Microbiology Recent Results (from the past 240 hour(s))  Resp Panel by RT-PCR (Flu A&B, Covid) Nasopharyngeal Swab     Status: None   Collection Time: 08/29/20 11:22 PM   Specimen: Nasopharyngeal Swab; Nasopharyngeal(NP) swabs in vial transport medium  Result Value Ref Range Status   SARS Coronavirus 2 by RT PCR NEGATIVE NEGATIVE Final    Comment: (NOTE) SARS-CoV-2 target nucleic acids are NOT DETECTED.  The SARS-CoV-2 RNA is generally detectable in upper respiratory specimens during the acute phase of infection. The lowest concentration of  SARS-CoV-2 viral copies this assay can detect is 138 copies/mL. A negative result does not preclude SARS-Cov-2 infection and should not be used as the sole basis for treatment or other patient management decisions. A negative result may occur with  improper specimen collection/handling, submission of specimen other than nasopharyngeal swab, presence of viral mutation(s) within the areas targeted by this assay, and inadequate number of viral copies(<138 copies/mL). A negative result must be combined with clinical observations, patient history, and epidemiological information. The expected result is Negative.  Fact Sheet for Patients:  EntrepreneurPulse.com.au  Fact  Sheet for Healthcare Providers:  IncredibleEmployment.be  This test is no t yet approved or cleared by the [place] FDA and  has been authorized for detection and/or diagnosis of SARS-CoV-2 by FDA under an Emergency Use Authorization (EUA). This EUA will remain  in effect (meaning this test can be used) for the duration of the COVID-19 declaration under Section 564(b)(1) of the Act, 21 U.S.C.section 360bbb-3(b)(1), unless the authorization is terminated  or revoked sooner.       Influenza A by PCR NEGATIVE NEGATIVE Final   Influenza B by PCR NEGATIVE NEGATIVE Final    Comment: (NOTE) The Xpert Xpress SARS-CoV-2/FLU/RSV plus assay is intended as an aid in the diagnosis of influenza from Nasopharyngeal swab specimens and should not be used as a sole basis for treatment. Nasal washings and aspirates are unacceptable for Xpert Xpress SARS-CoV-2/FLU/RSV testing.  Fact Sheet for Patients: EntrepreneurPulse.com.au  Fact Sheet for Healthcare Providers: IncredibleEmployment.be  This test is not yet approved or cleared by the [place] FDA and has been authorized for detection and/or diagnosis of SARS-CoV-2 by FDA under an Emergency Use  Authorization (EUA). This EUA will remain in effect (meaning this test can be used) for the duration of the COVID-19 declaration under Section 564(b)(1) of the Act, 21 U.S.C. section 360bbb-3(b)(1), unless the authorization is terminated or revoked.  Performed at [facility], [place] [address], [place], [place] [address]   MRSA Next Gen by PCR, Nasal     Status: None   Collection Time: 08/30/20  3:37 AM   Specimen: Nasal Mucosa; Nasal Swab  Result Value Ref Range Status   MRSA by PCR Next Gen NOT DETECTED NOT DETECTED Final    Comment: (NOTE) The GeneXpert MRSA Assay (FDA approved for NASAL specimens only), is one component of a comprehensive MRSA colonization surveillance program. It is not intended to diagnose MRSA infection nor to guide or monitor treatment for MRSA infections. Test performance is not FDA approved in patients less than 56 years old. Performed at [facility], [place] [address], [place], [place] [address]      Discharge Instructions:   Discharge Instructions     Diet general   Complete by: As directed       Allergies as of 09/01/2020       Reactions   Erythromycin Hives   Penicillins Hives        Medication List     STOP taking these medications    B Complex Plus Tabs   naltrexone 50 MG tablet Commonly known as: DEPADE   traZODone 50 MG tablet Commonly known as: DESYREL   Vitamin D3 50 MCG (2000 UT) capsule       TAKE these medications    buPROPion 300 MG 24 hr tablet Commonly known as: WELLBUTRIN XL Take 1 tablet (300 mg total) by mouth daily.   eszopiclone 1 MG Tabs tablet Commonly known as: LUNESTA Take 0.5-1 tablets (0.5-1 mg total) by mouth at bedtime as needed for sleep. Take immediately before bedtime   lamoTRIgine 150 MG tablet Commonly known as: LAMICTAL Take 1 tablet (150 mg total) by mouth daily.          Time coordinating discharge: 32 minutes  Signed:  [facility] 09/01/2020, 1:19 PM   Pager on [email or web address]. If 7PM-7AM, please contact night-coverage at [email or web address]

## 2020-09-01 NOTE — Plan of Care (Signed)
Received pt on wheelchair from ICU @ midnight,  pt alert, ambulatory, calm, no c/o pain, no s/s of respiratory distress O2 sat 99% RA, slept well thru the night and safety sitter at bedside, will continue plan of care.  Problem: Safety: Goal: Ability to remain free from injury will improve Outcome: Progressing   Problem: Coping: Goal: Level of anxiety will decrease Outcome: Progressing   Problem: Health Behavior/Discharge Planning: Goal: Ability to manage health-related needs will improve Outcome: Progressing

## 2020-09-01 NOTE — Progress Notes (Signed)
Patient discharged to [facility].  Left cooperatively with [employer] for transport.  Patient belongings returned before discharge: clothes, tablet, iphone, shoes, charging cords.  IV taken out before patient left.      Candie Mile, RN

## 2020-09-01 NOTE — Consult Note (Signed)
[facility] Face-to-Face Psychiatry Consult   Reason for Consult: Suicide attempt by overdose Referring Physician:  Dr. Mal Misty Patient Identification: Brooke Mcbride MRN:  [id number] Principal Diagnosis: Bipolar disorder (manic depression) ([place]) Diagnosis:  Principal Problem:   Bipolar disorder (manic depression) ([place]) Active Problems:   Alcohol abuse   Overdose   Total Time spent with patient: 45 minutes  Subjective:   Brooke Mcbride is a 50 y.o. female patient admitted with suicide attempt by overdose.  Patient reports getting into an argument with her 63 year old daughter, which led to an argument with her husband who subsequently left the house.  She states at which time she wanted to prove a point, in which she took " 18 Benadryl, a handful of Unisom and a handful of trazodone.  About 30 pills altogether" she states she did this not as an attempt to end her life, more so to get attention.  She does realize this was impulsive.  She endorses depressive symptoms that include decreased concentration, dysphoric mood, sleep disturbance, irritability, anxiety, fatigue.  She reports increased stresses at work that resulted in an increase in alcohol use.  She also reports history of alcohol use disorder, bipolar disorder, postpartum depression, and insomnia.  Recently she has been binge drinking nightly.  She reports drinking about 3-5 drinks after work, denies any compulsion.  Patient reports she has received outpatient services through partial hospitalization programming, multiple AA groups, and recently took 1 month off from work for outpatient substance abuse therapy.  She reports a history of self-harm, and previous suicide attempt.  She also reports 1 previous inpatient at [facility] for suicide attempt, in which she also reports was another cry for help.  As per patient" I had cut my wrists it was very superficial but enough to be admitted.  She reports developing  postpartum depression after both of her children were born, eventually stabilized.  She denies any current outpatient behavioral health services.  She denies any additional substance abuse use, and or current or pending legal charges.     Brooke Mcbride is a 50 year old female who presented with intentional overdose on sedative medication to include Benadryl, Unisom, and trazodone.  She currently resides with her husband of 27 years, and has 2 children ages 18 and 56.  She reports the 51 year old is away at college, her 51 year old recently graduated and will be attending college in the fall.  She currently works as a Marine scientist at [employer].  She describes her home environment as normal, however argues almost daily with her 67 year old daughter who she describes as disrespectful.  She reports recently having an argument with her daughter that resulted in her most recent suicide attempt, after her daughter threw away food in the trash and did not eat.  Patient denies any current outpatient psychiatrist, noting some resentment towards many psychiatrist she has seen throughout the years.  She suspects she has been misdiagnosed with bipolar disorder, and she seems to align more with depression than mania.  She is currently taking Wellbutrin and Lamictal, reports compliance with both medications.  She does appear to seek inpatient treatment, however does not agree with inpatient psychiatric hospital.  Despite her suicide attempt resulting in hospitalization in intensive care, patient continues to minimize her attempt, and lacks insight into her alcohol use disorder, mental health, and its effects on her body and family.  Patient remains high risk for suicide completion due to age, gender, alcohol use disorder, multiple underlying psychiatric disorders to  include manic depression, postpartum depression, and insomnia, history of previous suicide attempts, failed outpatient treatment, and self-harm behaviors.   Patient will benefit from inpatient psychiatric admission due to recent suicide attempt, and additional risk factors listed above.  HPI:  This is 50 yo with history of mood disorder and insomnia who presents for intentional overdose. Per husband they had an altercation and thus he left the house. She called him and told him she was going to take a handful of medications. HE told her not too but she did anyways. She told him that she was going to take benadryl, Unisom, and Trazodone. Husband estimates that she probably took 85 of each. She was initially conversive when husband got there. He notes that she drinks nightly about 3-5 drink and had ingested a significant amount this evening.   Husband notes that she has a mood disorder that was diagnosed as bipolar several years back. Patient is currently on Wellbutrin and Lamcital and takes these regularly. She had a similar experience about 15 years ago. Husband denies any recent manic episodes or SI/HI. No fevers. No chills No cough. No congestion. Overa no  health issues. She does drink nightly about 3-5 drinks. Does not drink during the day.     IN CMP remarkable for hypokalemia, Salicylates and tylneol level negative. Etoh elevated at 238. CBC reassuring,UDS negative, Covid negative. ABG with mild acidosis CO2 41 and PH 7.316. EKG with no prolonged interval  concerning ST changes.    Past Psychiatric History: Previous diagnosis of bipolar depression, however patient does not agree with this diagnosis.  Chart review indicates history of alcoholic hepatitis, history of opiate use disorder, and depression.  She is currently taking Wellbutrin 300 mg p.o. daily and Lamictal 150 mg p.o. daily, in which she reports compliance.  She currently does not have an outpatient psychiatrist, and is being closely followed by her primary care provider at [facility].  She has a prescription from her outpatient primary care provider for naltrexone 50 mg p.o. daily, patient does  not report taking this medication.  She reports history of suicide attempts by cutting of her wrist, that resulted in hospitalization over 20 years ago.  She also reports history of alcohol use disorder, in which she has received multiple treatments in the past to include intensive outpatient, partial hospitalization programming, and AA meetings.  Risk to Self:  Yes Risk to Others: No Prior Inpatient Therapy: 1 previous inpatient admission at [facility] Prior Outpatient Therapy: Previous alcohol use programs and treatments.  Past Medical History: History reviewed. No pertinent past medical history. History reviewed. No pertinent surgical history. Family History: History reviewed. No pertinent family history. Family Psychiatric  History: Denies Social History:  Social History   Substance and Sexual Activity  Alcohol Use None     Social History   Substance and Sexual Activity  Drug Use Not on file    Social History   Socioeconomic History   Marital status: Married    Spouse name: Not on file   Number of children: Not on file   Years of education: Not on file   Highest education level: Not on file  Occupational History   Not on file  Tobacco Use   Smoking status: Every Day    Pack years: 0.00   Smokeless tobacco: Never  Substance and Sexual Activity   Alcohol use: Not on file   Drug use: Not on file   Sexual activity: Yes  Other Topics Concern  Not on file  Social History Narrative   Not on file   Social Determinants of Health   Financial Resource Strain: Not on file  Food Insecurity: Not on file  Transportation Needs: Not on file  Physical Activity: Not on file  Stress: Not on file  Social Connections: Not on file   Additional Social History:    Allergies:   Allergies  Allergen Reactions   Erythromycin Hives   Penicillins Hives    Labs:  Results for orders placed or performed during the hospital encounter of 08/29/20 (from the past  48 hour(s))  Glucose, capillary     Status: None   Collection Time: 08/30/20 12:56 PM  Result Value Ref Range   Glucose-Capillary 81 70 - 99 mg/dL    Comment: Glucose reference range applies only to samples taken after fasting for at least 8 hours.  Glucose, capillary     Status: None   Collection Time: 08/30/20  4:11 PM  Result Value Ref Range   Glucose-Capillary 99 70 - 99 mg/dL    Comment: Glucose reference range applies only to samples taken after fasting for at least 8 hours.   Comment 1 Notify RN    Comment 2 Document in Chart   Glucose, capillary     Status: None   Collection Time: 08/30/20  7:25 PM  Result Value Ref Range   Glucose-Capillary 83 70 - 99 mg/dL    Comment: Glucose reference range applies only to samples taken after fasting for at least 8 hours.  Glucose, capillary     Status: Abnormal   Collection Time: 08/30/20 11:02 PM  Result Value Ref Range   Glucose-Capillary 115 (H) 70 - 99 mg/dL    Comment: Glucose reference range applies only to samples taken after fasting for at least 8 hours.   Comment 1 Notify RN   Glucose, capillary     Status: None   Collection Time: 08/31/20  8:23 AM  Result Value Ref Range   Glucose-Capillary 86 70 - 99 mg/dL    Comment: Glucose reference range applies only to samples taken after fasting for at least 8 hours.   Comment 1 Notify RN    Comment 2 Document in Chart   Glucose, capillary     Status: Abnormal   Collection Time: 08/31/20 11:49 AM  Result Value Ref Range   Glucose-Capillary 102 (H) 70 - 99 mg/dL    Comment: Glucose reference range applies only to samples taken after fasting for at least 8 hours.   Comment 1 Notify RN    Comment 2 Document in Chart   Lactic acid, plasma     Status: None   Collection Time: 08/31/20  1:03 PM  Result Value Ref Range   Lactic Acid, Venous 1.0 0.5 - 1.9 mmol/L    Comment: Performed at [facility], [place] [address] [place], [place] [address]  Basic metabolic panel      Status: Abnormal   Collection Time: 08/31/20  1:03 PM  Result Value Ref Range   Sodium 138 135 - 145 mmol/L   Potassium 3.4 (L) 3.5 - 5.1 mmol/L   Chloride 108 98 - 111 mmol/L   CO2 24 22 - 32 mmol/L   Glucose, Bld 102 (H) 70 - 99 mg/dL    Comment: Glucose reference range applies only to samples taken after fasting for at least 8 hours.   BUN 8 6 - 20 mg/dL   Creatinine, Ser 0.87 0.44 - 1.00 mg/dL  Calcium 8.1 (L) 8.9 - 10.3 mg/dL   GFR, Estimated >60 >60 mL/min    Comment: (NOTE) Calculated using the CKD-EPI Creatinine Equation (2021)    Anion gap 6 5 - 15    Comment: Performed at [facility], [place] [address], [place], [place] [address]  Glucose, capillary     Status: Abnormal   Collection Time: 08/31/20  3:27 PM  Result Value Ref Range   Glucose-Capillary 120 (H) 70 - 99 mg/dL    Comment: Glucose reference range applies only to samples taken after fasting for at least 8 hours.   Comment 1 Notify RN    Comment 2 Document in Chart   Glucose, capillary     Status: Abnormal   Collection Time: 08/31/20  7:25 PM  Result Value Ref Range   Glucose-Capillary 104 (H) 70 - 99 mg/dL    Comment: Glucose reference range applies only to samples taken after fasting for at least 8 hours.  Glucose, capillary     Status: Abnormal   Collection Time: 08/31/20 11:37 PM  Result Value Ref Range   Glucose-Capillary 106 (H) 70 - 99 mg/dL    Comment: Glucose reference range applies only to samples taken after fasting for at least 8 hours.  Glucose, capillary     Status: None   Collection Time: 09/01/20  3:48 AM  Result Value Ref Range   Glucose-Capillary 94 70 - 99 mg/dL    Comment: Glucose reference range applies only to samples taken after fasting for at least 8 hours.  CBC with Differential/Platelet     Status: Abnormal   Collection Time: 09/01/20  3:49 AM  Result Value Ref Range   WBC 9.5 4.0 - 10.5 K/uL   RBC 3.52 (L) 3.87 - 5.11 MIL/uL   Hemoglobin 12.6 12.0 - 15.0 g/dL    HCT 37.4 36.0 - 46.0 %   MCV 106.3 (H) 80.0 - 100.0 fL   MCH 35.8 (H) 26.0 - 34.0 pg   MCHC 33.7 30.0 - 36.0 g/dL   RDW 12.2 11.5 - 15.5 %   Platelets 190 150 - 400 K/uL   nRBC 0.0 0.0 - 0.2 %   Neutrophils Relative % 60 %   Neutro Abs 5.7 1.7 - 7.7 K/uL   Lymphocytes Relative 28 %   Lymphs Abs 2.7 0.7 - 4.0 K/uL   Monocytes Relative 7 %   Monocytes Absolute 0.7 0.1 - 1.0 K/uL   Eosinophils Relative 4 %   Eosinophils Absolute 0.4 0.0 - 0.5 K/uL   Basophils Relative 1 %   Basophils Absolute 0.1 0.0 - 0.1 K/uL   Immature Granulocytes 0 %   Abs Immature Granulocytes 0.03 0.00 - 0.07 K/uL    Comment: Performed at [facility], [place] [address], [place], [place] [address]  Basic metabolic panel     Status: Abnormal   Collection Time: 09/01/20  3:49 AM  Result Value Ref Range   Sodium 136 135 - 145 mmol/L   Potassium 4.2 3.5 - 5.1 mmol/L    Comment: DELTA CHECK NOTED   Chloride 108 98 - 111 mmol/L   CO2 20 (L) 22 - 32 mmol/L   Glucose, Bld 91 70 - 99 mg/dL    Comment: Glucose reference range applies only to samples taken after fasting for at least 8 hours.   BUN 6 6 - 20 mg/dL   Creatinine, Ser 0.77 0.44 - 1.00 mg/dL   Calcium 8.3 (L) 8.9 - 10.3 mg/dL  GFR, Estimated >60 >60 mL/min    Comment: (NOTE) Calculated using the CKD-EPI Creatinine Equation (2021)    Anion gap 8 5 - 15    Comment: Performed at [facility], [place] [address], [place], [place] [address]  Magnesium     Status: None   Collection Time: 09/01/20  3:49 AM  Result Value Ref Range   Magnesium 1.8 1.7 - 2.4 mg/dL    Comment: Performed at [facility], [place] [address] [place], [place] [address]  Glucose, capillary     Status: None   Collection Time: 09/01/20  7:32 AM  Result Value Ref Range   Glucose-Capillary 97 70 - 99 mg/dL    Comment: Glucose reference range applies only to samples taken after fasting for at least 8 hours.    Current  Facility-Administered Medications  Medication Dose Route Frequency Provider Last Rate Last Admin   acetaminophen (TYLENOL) tablet 650 mg  650 mg Oral Q6H PRN Hunsucker, Bonna Gains, MD   650 mg at 08/31/20 1507   buPROPion (WELLBUTRIN XL) 24 hr tablet 300 mg  300 mg Oral Daily Jennye Boroughs, MD       Chlorhexidine Gluconate Cloth 2 % PADS 6 each  6 each Topical Q0600 Tyna Jaksch, MD   6 each at 09/01/20 5621   docusate sodium (COLACE) capsule 100 mg  100 mg Oral BID PRN Tyna Jaksch, MD       enoxaparin (LOVENOX) injection 40 mg  40 mg Subcutaneous Daily Tanda Rockers, MD   40 mg at 09/01/20 1016   famotidine (PEPCID) tablet 20 mg  20 mg Oral Daily Green, Terri L, RPH   20 mg at 09/01/20 1019   labetalol (NORMODYNE) injection 10 mg  10 mg Intravenous Q4H PRN Hunsucker, Bonna Gains, MD   10 mg at 08/31/20 2003   lamoTRIgine (LAMICTAL) tablet 150 mg  150 mg Oral Daily Jennye Boroughs, MD       LORazepam (ATIVAN) tablet 1-4 mg  1-4 mg Oral Q1H PRN Hunsucker, Bonna Gains, MD   2 mg at 08/31/20 1949   Or   LORazepam (ATIVAN) injection 1-4 mg  1-4 mg Intravenous Q1H PRN Hunsucker, Bonna Gains, MD   2 mg at 08/31/20 2204   MEDLINE mouth rinse  15 mL Mouth Rinse BID Tanda Rockers, MD   15 mL at 09/01/20 1019   polyethylene glycol (MIRALAX / GLYCOLAX) packet 17 g  17 g Oral Daily PRN Tyna Jaksch, MD       potassium chloride (KLOR-CON) packet 40 mEq  40 mEq Oral BID Magdalen Spatz, NP   40 mEq at 09/01/20 1018    Musculoskeletal: Strength & Muscle Tone: within normal limits Gait & Station: normal Patient leans: N/A            Psychiatric Specialty Exam:  Presentation  General Appearance: Appropriate for Environment; Casual  Eye Contact:Fair  Speech:Clear and Coherent; Normal Rate  Speech Volume:Normal  Handedness:Right   Mood and Affect  Mood:Anxious; Irritable  Affect:Tearful; Restricted; Appropriate; Congruent   Thought Process  Thought  Processes:Coherent  Descriptions of Associations:Intact  Orientation:Full (Time, Place and Person)  Thought Content:Logical  History of Schizophrenia/Schizoaffective disorder:No data recorded Duration of Psychotic Symptoms:No data recorded Hallucinations:Hallucinations: None  Ideas of Reference:None  Suicidal Thoughts:Suicidal Thoughts: Yes, Active SI Active Intent and/or Plan: With Intent; With Plan; With Means to [place]; With Access to Means (denies, although she presented with suicide attempt by overdose)  Homicidal Thoughts:Homicidal Thoughts: No   Sensorium  Memory:Immediate Good; Recent Good; Remote Fair  Judgment:Fair  Insight:Fair   Executive Functions  Concentration:Fair  Attention Span:Fair  [place]   Psychomotor Activity  Psychomotor Activity:Psychomotor Activity: Normal   Assets  Assets:Communication Skills; Desire for Improvement; Leisure Time; Physical Health; Vocational/Educational; Social Support; Resilience; Financial Resources/Insurance; Housing   Sleep  Sleep:Sleep: Fair   Physical Exam: Physical Exam Vitals and nursing note reviewed.  Constitutional:      Appearance: Normal appearance. She is normal weight.  HENT:     Head: Normocephalic.  Eyes:     Pupils: Pupils are equal, round, and reactive to light.  Neurological:     Mental Status: She is alert.  Psychiatric:        Attention and Perception: Attention and perception normal.        Mood and Affect: Mood is anxious. Affect is labile and tearful.        Speech: Speech normal.        Behavior: Hyperactive: restless. Behavior is cooperative.        Thought Content: Thought content normal.        Cognition and Memory: Cognition and memory normal.        Judgment: Judgment is impulsive.   Review of Systems  Psychiatric/Behavioral:  Positive for depression, substance abuse (alcohol) and suicidal ideas. The patient is  nervous/anxious and has insomnia.   All other systems reviewed and are negative. Blood pressure (!) 156/83, pulse 75, temperature 98.8 F (37.1 C), temperature source Oral, resp. rate 18, height 5\' 5"  (1.651 m), weight 73.5 kg, SpO2 98 %. Body mass index is 26.96 kg/m.  Treatment Plan Summary: Plan recommend inpatient psychiatric admission.  Contact has been made with [facility] behavioral health for possible bed admission.  Patient and husband are in agreement with inpatient psych hospitalization, although patient appears resistant to treatment and would rather go home. -Continue one-to-one Air cabin crew, as patient may be a high risk to elope or leave the hospital prior to transfer.  At present she is voluntarily willing to go to inpatient psych, however she is high risk to elope. -Recommend resuming home medications, to prevent discontinuation syndrome, and worsening suicidal ideations. -Recommend inpatient psychiatric admission.  If no bed is available at [facility], recommend referring out of system if appropriate. -Also discussed in detail with both patient and husband regarding seamless transition to the inpatient residential substance abuse facility.  Discussed contacting insurance company to inquire about logistics such as in network and out of network, as well as which residential programs they provide coverage for.  The above information has been discussed with attending primary team, who states patient is medically cleared for inpatient psych admission. Disposition: Recommend psychiatric Inpatient admission when medically cleared.  Suella Broad, FNP 09/01/2020 10:50 AM

## 2020-09-01 NOTE — TOC Progression Note (Signed)
Transition of Care [facility]) - Progression Note    Patient Details  Name: Brooke Mcbride MRN: [id number] Date of Birth: 04/29/1970  [facility] CM/SW Contact  Ross Ludwig, [place] Phone Number: 09/01/2020, 12:51 PM  Clinical Narrative:     CSW spoke to the [facility] in regards to inpatient placement.  Patient is under review, she will contact CSW if bed is available and they can accept her.  CSW to continue to follow patient's progress throughout discharge planning.   Expected Discharge Plan: Home/Self Care Barriers to Discharge: Continued Medical Work up  Expected Discharge Plan and Services Expected Discharge Plan: Home/Self Care   Discharge Planning Services: CM Consult   Living arrangements for the past 2 months: Single Family Home                                       Social Determinants of Health (SDOH) Interventions    Readmission Risk Interventions No flowsheet data found.

## 2020-09-02 ENCOUNTER — Other Ambulatory Visit: Payer: Self-pay

## 2020-09-02 DIAGNOSIS — F332 Major depressive disorder, recurrent severe without psychotic features: Secondary | ICD-10-CM

## 2020-09-02 MED ORDER — GABAPENTIN 100 MG PO CAPS
100.0000 mg | ORAL_CAPSULE | Freq: Three times a day (TID) | ORAL | Status: DC
  Administered 2020-09-02 – 2020-09-03 (×2): 100 mg via ORAL
  Filled 2020-09-02 (×7): qty 1

## 2020-09-02 MED ORDER — NICOTINE POLACRILEX 2 MG MT GUM
2.0000 mg | CHEWING_GUM | OROMUCOSAL | Status: DC | PRN
  Administered 2020-09-02 – 2020-09-04 (×4): 2 mg via ORAL
  Filled 2020-09-02: qty 1
  Filled 2020-09-02: qty 10

## 2020-09-02 MED ORDER — MIRTAZAPINE 7.5 MG PO TABS
7.5000 mg | ORAL_TABLET | Freq: Every day | ORAL | Status: DC
  Administered 2020-09-02: 7.5 mg via ORAL
  Filled 2020-09-02 (×3): qty 1

## 2020-09-02 MED ORDER — POTASSIUM CHLORIDE CRYS ER 20 MEQ PO TBCR
40.0000 meq | EXTENDED_RELEASE_TABLET | Freq: Two times a day (BID) | ORAL | Status: DC
  Administered 2020-09-02: 40 meq via ORAL
  Filled 2020-09-02 (×5): qty 2

## 2020-09-02 NOTE — [facility] Group Notes (Signed)
[facility] Group Notes:  (Nursing/MHT/Case Management/Adjunct)  Date:  09/02/2020  Time:  9:38 AM  Type of Therapy:   Goals Group  Participation Level:  Active  Participation Quality:  Appropriate, Attentive, and Sharing  Affect:  Blunted  Cognitive:  Alert and Appropriate  Insight:  Improving  Engagement in Group:  Developing/Improving and Engaged  Modes of Intervention:  Discussion and Education  Summary of Progress/Problems:   Brooke Mcbride reported her goal for today is "talk with doctor or social worker about getting out of here to go to a 30 day treatment program."  She didn't sleep well as she came in early this morning.  Brooke Mcbride 09/02/2020, 9:38 AM
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
## 2020-09-02 NOTE — Tx Team (Signed)
Initial Treatment Plan 09/02/2020 12:09 AM Brooke Mcbride ZPS:[id number]    PATIENT STRESSORS: Marital or family conflict Substance abuse Traumatic event   PATIENT STRENGTHS: Child psychotherapist Supportive family/friends Work skills   PATIENT IDENTIFIED PROBLEMS: Anxiety  Substance abuse  "Get out of here"  "Fix this mess"               DISCHARGE CRITERIA:  Ability to meet basic life and health needs Improved stabilization in mood, thinking, and/or behavior Medical problems require only outpatient monitoring Motivation to continue treatment in a less acute level of care Verbal commitment to aftercare and medication compliance  PRELIMINARY DISCHARGE PLAN: Attend aftercare/continuing care group Attend PHP/IOP Attend 12-step recovery group Outpatient therapy Return to previous living arrangement Return to previous work or school arrangements  PATIENT/FAMILY INVOLVEMENT: This treatment plan has been presented to and reviewed with the patient, Brooke Mcbride, and/or family member.  The patient and family have been given the opportunity to ask questions and make suggestions.  Wolfgang Phoenix, RN 09/02/2020, 12:09 AM

## 2020-09-02 NOTE — Progress Notes (Signed)
Progress note  Pt found in the hallway; compliant with morning medication pass. Pt had complaints o anxiety this morning. Pt states they felt as though they had tremors but could not be seen. Pt was animated but stern when discussing the reason they were here and how this was a mistake. Pt seemed happy they were going to rehab and felt like this would help them greatly. Pt denies si/hi/ah/vh and verbally agrees to approach staff if these become apparent or before harming themselves/others while at [place].  A: Pt provided support and encouragement. Pt given medication per protocol and standing orders. Q12m safety checks implemented and continued.  R: Pt safe on the unit. Will continue to monitor.

## 2020-09-02 NOTE — Plan of Care (Signed)
  Problem: Education: Goal: Ability to make informed decisions regarding treatment will improve Outcome: Progressing   Problem: Coping: Goal: Coping ability will improve Outcome: Progressing   Problem: Medication: Goal: Compliance with prescribed medication regimen will improve Outcome: Progressing   

## 2020-09-02 NOTE — Progress Notes (Signed)
   09/02/20 2031  Psych Admission Type (Psych Patients Only)  Admission Status Involuntary  Psychosocial Assessment  Patient Complaints Anxiety  Eye Contact Fair  Facial Expression Anxious  Affect Appropriate to circumstance  Speech Logical/coherent  Interaction Arrogant;Assertive  Motor Activity Fidgety  Appearance/Hygiene Unremarkable  Behavior Characteristics Cooperative;Appropriate to situation  Mood Depressed;Anxious  Thought Process  Coherency Concrete thinking  Content Ambivalence;Blaming others  Delusions Controlled;Persecutory  Perception WDL  Hallucination None reported or observed  Judgment Poor  Confusion None  Danger to Self  Current suicidal ideation? Denies  Danger to Others  Danger to Others None reported or observed
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
## 2020-09-02 NOTE — Progress Notes (Signed)
Brooke Mcbride is a 50 y.o. female involuntarily admitted for suicide attempt buy overdosing on unknown amount of her medication the drunk alcohol. Pt stated this was not a suicide attempt, pt just wanted to make her husband and daughter know that she meant business. Pt stated she had an urtication with her daughter and husband, then they left leaving the patient alone in the house. The pt then called the husband stating that she took the pill who went and took her to the hospital. Pt has been labile and irritable during admission.   Pt stated that this place is not for her, she is interest in a long term treatment. Pt also stated she signed the voluntary form, but was surprised to realize that she was her involuntarily. Pt is alert and oriented, denies SI/HI and contracted for safety. Consents signed, skin/belongings search completed and pt oriented to unit. Pt stable at this time. Pt given the opportunity to express concerns and ask questions. Pt given toiletries. Will continue to monitor.

## 2020-09-03 LAB — BASIC METABOLIC PANEL
Anion gap: 9 (ref 5–15)
BUN: 11 mg/dL (ref 6–20)
CO2: 24 mmol/L (ref 22–32)
Calcium: 9.8 mg/dL (ref 8.9–10.3)
Chloride: 103 mmol/L (ref 98–111)
Creatinine, Ser: 0.88 mg/dL (ref 0.44–1.00)
GFR, Estimated: >60 mL/min (ref >60)
Glucose, Bld: 120 mg/dL — ABNORMAL HIGH (ref 70–99)
Potassium: 4.5 mmol/L (ref 3.5–5.1)
Sodium: 136 mmol/L (ref 135–145)

## 2020-09-03 LAB — LIPID PANEL
Cholesterol: 210 mg/dL — ABNORMAL HIGH (ref 0–200)
HDL: 70 mg/dL (ref >40)
LDL Cholesterol: 124 mg/dL — ABNORMAL HIGH (ref 0–99)
Total CHOL/HDL Ratio: 3 RATIO
Triglycerides: 78 mg/dL (ref <150)
VLDL: 16 mg/dL (ref 0–40)

## 2020-09-03 LAB — HEMOGLOBIN A1C
Hgb A1c MFr Bld: 5 % (ref 4.8–5.6)
Mean Plasma Glucose: 96.8 mg/dL

## 2020-09-03 MED ORDER — GABAPENTIN 100 MG PO CAPS
200.0000 mg | ORAL_CAPSULE | Freq: Three times a day (TID) | ORAL | Status: DC
  Administered 2020-09-03: 200 mg via ORAL
  Filled 2020-09-03 (×5): qty 2

## 2020-09-03 MED ORDER — MIRTAZAPINE 15 MG PO TABS
15.0000 mg | ORAL_TABLET | Freq: Every day | ORAL | Status: DC
  Administered 2020-09-03 – 2020-09-04 (×2): 15 mg via ORAL
  Filled 2020-09-03 (×4): qty 1

## 2020-09-03 MED ORDER — GABAPENTIN 100 MG PO CAPS
200.0000 mg | ORAL_CAPSULE | Freq: Three times a day (TID) | ORAL | Status: AC
  Administered 2020-09-04 (×2): 200 mg via ORAL
  Filled 2020-09-03 (×3): qty 2

## 2020-09-03 MED ORDER — CLONIDINE HCL 0.1 MG PO TABS: 0.1000 mg | ORAL_TABLET | Freq: Three times a day (TID) | ORAL | Status: DC | PRN

## 2020-09-03 MED ORDER — GABAPENTIN 100 MG PO CAPS
100.0000 mg | ORAL_CAPSULE | Freq: Three times a day (TID) | ORAL | Status: AC
  Administered 2020-09-03: 100 mg via ORAL
  Filled 2020-09-03: qty 1

## 2020-09-03 MED ORDER — GABAPENTIN 300 MG PO CAPS
300.0000 mg | ORAL_CAPSULE | Freq: Three times a day (TID) | ORAL | Status: DC
  Administered 2020-09-04 – 2020-09-05 (×3): 300 mg via ORAL
  Filled 2020-09-03 (×8): qty 1
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
## 2020-09-03 NOTE — Progress Notes (Signed)
     09/03/20 2142  Psych Admission Type (Psych Patients Only)  Admission Status Involuntary  Psychosocial Assessment  Patient Complaints Anxiety  Eye Contact Fair  Facial Expression Animated;Anxious  Affect Anxious;Appropriate to circumstance  Speech Logical/coherent  Interaction Assertive  Motor Activity Slow  Appearance/Hygiene Unremarkable  Behavior Characteristics Cooperative;Appropriate to situation;Anxious  Mood Anxious;Pleasant  Thought Process  Coherency Concrete thinking  Content WDL  Delusions None reported or observed  Perception WDL  Hallucination None reported or observed  Judgment Poor  Confusion None  Danger to Self  Current suicidal ideation? Denies  Danger to Others  Danger to Others None reported or observed

## 2020-09-03 NOTE — Progress Notes (Signed)
Recreation Therapy Notes  Date: 7.8.22 Time: 0930 Location: 300 Hall Dayroom  Group Topic: Stress Management   Goal Area(s) Addresses:  Patient will actively participate in stress management techniques presented during session.  Patient will successfully identify benefit of practicing stress management post d/c.   Intervention: Guided exercise with ambient sound and script  Activity :Guided Imagery  LRT provided education, instruction, and demonstration on practice of visualization via guided imagery. Patient was asked to participate in the technique introduced during session. LRT also debriefed including topics of mindfulness, stress management and specific scenarios each patient could use these techniques. Patients were given suggestions of ways to access scripts post d/c and encouraged to explore Youtube and other apps available on smartphones, tablets, and computers.   Education:  Stress Management, Discharge Planning.   Education Outcome: Acknowledges education  Clinical Observations/Feedback:  Group did not occur due to goals group going over time.     Victorino Sparrow, LRT/CTRS     Victorino Sparrow A 09/03/2020 10:38 AM

## 2020-09-03 NOTE — Plan of Care (Signed)
  Problem: Education: Goal: Knowledge of [facility] Education information/materials will improve Outcome: Progressing   Problem: Physical Regulation: Goal: Complications related to the disease process, condition or treatment will be avoided or minimized Outcome: Progressing   Problem: Safety: Goal: Ability to remain free from injury will improve Outcome: Progressing

## 2020-09-03 NOTE — Plan of Care (Signed)
  Problem: Education: Goal: Ability to make informed decisions regarding treatment will improve Outcome: Progressing   Problem: Coping: Goal: Coping ability will improve Outcome: Progressing   Problem: Medication: Goal: Compliance with prescribed medication regimen will improve Outcome: Progressing   

## 2020-09-03 NOTE — BHH Counselor (Signed)
CSW received an email from Mr. Cameshia Cressman containing the [place] paperwork for the patient and the [facility] doctor.  CSW took the paperwork to the patient to have her sign in the places that required her signature.  The patient stated that she wanted to read over the packet of information prior to giving it to the doctor to be completed.  CSW stated that the patient may do this and provide the paperwork to the weekend CSW who can then provide it to the doctor.  The patient stated that she would do this.  CSW has provided the weekend social work with this information as well.

## 2020-09-03 NOTE — BHH Group Notes (Signed)
[facility] Group Notes:  (Nursing/MHT/Case Management/Adjunct)  Date:  09/03/2020  Time:  6:42 PM  Type of Therapy:   Goals and Orientation group  Participation Level:  Active  Participation Quality:  Appropriate and Attentive  Affect:  Blunted  Cognitive:  Alert and Appropriate  Insight:  Improving  Engagement in Group:  Engaged  Modes of Intervention:  Discussion and Education  Summary of Progress/Problems:  She reported her goal for today was "find out if my husband talked to Education officer, museum about her long term placement."  She slept well last night.  [place] 09/03/2020, 6:42 PM

## 2020-09-03 NOTE — Progress Notes (Addendum)
Progress note  Pt found in bed; compliant with morning mediations. Pt denies any detox symptoms. Pt did request supplementation for nicotine withdrawal. Pt feels the gum may be making them anxious. Pt has been visible on the milieu and making phone calls, supposedly for treatment. Pt is pleasant and feels less feelings of this being beneath them. Pt denies si/hi/ah/vh and verbally agrees to approach staff if these become apparent or before harming themselves/others while at [facility].  A: Pt provided support and encouragement. Pt given medication per protocol and standing orders. Q60m safety checks implemented and continued.  R: Pt safe on the unit. Will continue to monitor.

## 2020-09-03 NOTE — BHH Group Notes (Signed)
Type of Therapy and Topic:  Group Therapy - Healthy vs Unhealthy Coping Skills  Participation Level:  Active   Description of Group The focus of this group was to determine what unhealthy coping techniques typically are used by group members and what healthy coping techniques would be helpful in coping with various problems. Patients were guided in becoming aware of the differences between healthy and unhealthy coping techniques. Patients were asked to identify 2-3 healthy coping skills they would like to learn to use more effectively.  Therapeutic Goals Patients learned that coping is what human beings do all day long to deal with various situations in their lives Patients defined and discussed healthy vs unhealthy coping techniques Patients identified their preferred coping techniques and identified whether these were healthy or unhealthy Patients determined 2-3 healthy coping skills they would like to become more familiar with and use more often. Patients provided support and ideas to each other   Summary of Patient Progress:  Due to the acuity and complex discharge plans, group was not held. Patient was provided therapeutic worksheets and asked to meet with CSW as needed.

## 2020-09-03 NOTE — BHH Suicide Risk Assessment (Signed)
[facility] INPATIENT:  Family/Significant Other Suicide Prevention Education  Suicide Prevention Education:  Education Completed; Breeanna Galgano [phone] (Husband) has been identified by the patient as the family member/significant other with whom the patient will be residing, and identified as the person(s) who will aid the patient in the event of a mental health crisis (suicidal ideations/suicide attempt).  With written consent from the patient, the family member/significant other has been provided the following suicide prevention education, prior to the and/or following the discharge of the patient.  The suicide prevention education provided includes the following: Suicide risk factors Suicide prevention and interventions National Suicide Hotline telephone number [facility] assessment telephone number [facility] Emergency Assistance [place] and/or Residential Mobile Crisis Unit telephone number  Request made of family/significant other to: Remove weapons (e.g., guns, rifles, knives), all items previously/currently identified as safety concern.   Remove drugs/medications (over-the-counter, prescriptions, illicit drugs), all items previously/currently identified as a safety concern.  The family member/significant other verbalizes understanding of the suicide prevention education information provided.  The family member/significant other agrees to remove the items of safety concern listed above.  CSW spoke with Mr. Estabrook who states that he would like his wife to go to residential treatment before she come back to the home.  He states that he has contacted his insurance and they say that [facility] is one of the residential treatment providers that will accept their insurance.  CSW provided Mr. Philyaw with 2 other options that she will fax information too.  Mr. Heckstall states that there are no weapons or firearms in the home and that he will be  working with his wife to find her treatment and help her stop drinking alcohol.  CSW completed SPE with Mr. Fiske.   Frutoso Chase Nahla Lukin 09/03/2020, 1:19 PM

## 2020-09-03 NOTE — Tx Team (Signed)
Interdisciplinary Treatment and Diagnostic Plan Update  09/03/2020 Time of Session: 9:30am Brooke Mcbride MRN: [id number]  Principal Diagnosis: MDD (major depressive disorder), recurrent severe, without psychosis ([place])  Secondary Diagnoses: Principal Problem:   MDD (major depressive disorder), recurrent severe, without psychosis ([place]) Active Problems:   Tobacco use   Alcohol abuse   Current Medications:  Current Facility-Administered Medications  Medication Dose Route Frequency Provider Last Rate Last Admin   acetaminophen (TYLENOL) tablet 650 mg  650 mg Oral Q6H PRN Starkes-Perry, Gayland Curry, FNP       alum & mag hydroxide-simeth (MAALOX/MYLANTA) 200-200-20 MG/5ML suspension 30 mL  30 mL Oral Q4H PRN Starkes-Perry, Gayland Curry, FNP       docusate sodium (COLACE) capsule 100 mg  100 mg Oral BID PRN Suella Broad, FNP       famotidine (PEPCID) tablet 20 mg  20 mg Oral Daily Suella Broad, FNP   20 mg at 09/03/20 0735   gabapentin (NEURONTIN) capsule 100 mg  100 mg Oral TID Corky Sox, MD   100 mg at 09/03/20 0735   hydrOXYzine (ATARAX/VISTARIL) tablet 25 mg  25 mg Oral Q6H PRN Rozetta Nunnery, NP       loperamide (IMODIUM) capsule 2-4 mg  2-4 mg Oral PRN Rozetta Nunnery, NP       LORazepam (ATIVAN) tablet 1 mg  1 mg Oral Q6H PRN Rozetta Nunnery, NP       LORazepam (ATIVAN) tablet 1 mg  1 mg Oral TID Rozetta Nunnery, NP       Followed by   Derrill Memo ON 09/04/2020] LORazepam (ATIVAN) tablet 1 mg  1 mg Oral BID Rozetta Nunnery, NP       Followed by   Derrill Memo ON 09/06/2020] LORazepam (ATIVAN) tablet 1 mg  1 mg Oral Daily Lindon Romp A, NP       magnesium hydroxide (MILK OF MAGNESIA) suspension 30 mL  30 mL Oral Daily PRN Suella Broad, FNP       mirtazapine (REMERON) tablet 7.5 mg  7.5 mg Oral QHS Corky Sox, MD   7.5 mg at 09/02/20 2102   nicotine polacrilex (NICORETTE) gum 2 mg  2 mg Oral PRN Harlow Asa, MD   2 mg at 09/02/20 1830   ondansetron  (ZOFRAN-ODT) disintegrating tablet 4 mg  4 mg Oral Q6H PRN Rozetta Nunnery, NP       polyethylene glycol (MIRALAX / GLYCOLAX) packet 17 g  17 g Oral Daily PRN Starkes-Perry, Gayland Curry, FNP       PTA Medications: Medications Prior to Admission  Medication Sig Dispense Refill Last Dose   buPROPion (WELLBUTRIN XL) 300 MG 24 hr tablet Take 1 tablet (300 mg total) by mouth daily.      eszopiclone (LUNESTA) 1 MG TABS tablet Take 0.5-1 tablets (0.5-1 mg total) by mouth at bedtime as needed for sleep. Take immediately before bedtime 30 tablet 0    lamoTRIgine (LAMICTAL) 150 MG tablet Take 1 tablet (150 mg total) by mouth daily.       Patient Stressors: Marital or family conflict Substance abuse Traumatic event  Patient Strengths: Child psychotherapist Supportive family/friends Work skills  Treatment Modalities: Medication Management, Group therapy, Case management,  1 to 1 session with clinician, Psychoeducation, Recreational therapy.   Physician Treatment Plan for Primary Diagnosis: MDD (major depressive disorder), recurrent severe, without psychosis ([place]) Long Term Goal(s): Improvement in symptoms so as ready for discharge   Short  Term Goals: Ability to disclose and discuss suicidal ideas Ability to demonstrate self-control will improve Ability to identify and develop effective coping behaviors will improve  Medication Management: Evaluate patient's response, side effects, and tolerance of medication regimen.  Therapeutic Interventions: 1 to 1 sessions, Unit Group sessions and Medication administration.  Evaluation of Outcomes: Not Met  Physician Treatment Plan for Secondary Diagnosis: Principal Problem:   MDD (major depressive disorder), recurrent severe, without psychosis ([place]) Active Problems:   Tobacco use   Alcohol abuse  Long Term Goal(s): Improvement in symptoms so as ready for discharge   Short Term Goals: Ability to disclose and discuss suicidal  ideas Ability to demonstrate self-control will improve Ability to identify and develop effective coping behaviors will improve     Medication Management: Evaluate patient's response, side effects, and tolerance of medication regimen.  Therapeutic Interventions: 1 to 1 sessions, Unit Group sessions and Medication administration.  Evaluation of Outcomes: Not Met   RN Treatment Plan for Primary Diagnosis: MDD (major depressive disorder), recurrent severe, without psychosis ([place]) Long Term Goal(s): Knowledge of disease and therapeutic regimen to maintain health will improve  Short Term Goals: Ability to remain free from injury will improve, Ability to verbalize frustration and anger appropriately will improve, Ability to demonstrate self-control, Ability to identify and develop effective coping behaviors will improve, and Compliance with prescribed medications will improve  Medication Management: RN will administer medications as ordered by provider, will assess and evaluate patient's response and provide education to patient for prescribed medication. RN will report any adverse and/or side effects to prescribing provider.  Therapeutic Interventions: 1 on 1 counseling sessions, Psychoeducation, Medication administration, Evaluate responses to treatment, Monitor vital signs and CBGs as ordered, Perform/monitor CIWA, COWS, AIMS and Fall Risk screenings as ordered, Perform wound care treatments as ordered.  Evaluation of Outcomes: Not Met   LCSW Treatment Plan for Primary Diagnosis: MDD (major depressive disorder), recurrent severe, without psychosis ([place]) Long Term Goal(s): Safe transition to appropriate next level of care at discharge, Engage patient in therapeutic group addressing interpersonal concerns.  Short Term Goals: Engage patient in aftercare planning with referrals and resources, Increase social support, Increase ability to appropriately verbalize feelings, Facilitate patient  progression through stages of change regarding substance use diagnoses and concerns, Identify triggers associated with mental health/substance abuse issues, and Increase skills for wellness and recovery  Therapeutic Interventions: Assess for all discharge needs, 1 to 1 time with Social worker, Explore available resources and support systems, Assess for adequacy in community support network, Educate family and significant other(s) on suicide prevention, Complete Psychosocial Assessment, Interpersonal group therapy.  Evaluation of Outcomes: Not Met   Progress in Treatment: Attending groups: Yes. Participating in groups: Yes. Taking medication as prescribed: Yes. Toleration medication: Yes. Family/Significant other contact made: No, will contact:  husband Patient understands diagnosis: Yes. Discussing patient identified problems/goals with staff: Yes. Medical problems stabilized or resolved: Yes. Denies suicidal/homicidal ideation: Yes. Issues/concerns per patient self-inventory: No.   New problem(s) identified: No, Describe:  none  New Short Term/Long Term Goal(s): detox, medication management for mood stabilization; elimination of SI thoughts; development of comprehensive mental wellness/sobriety plan  Patient Goals:  Did not attend  Discharge Plan or Barriers: Pt is interested in residential substance use treatment at discharge.  Reason for Continuation of Hospitalization: Anxiety Depression Medication stabilization Suicidal ideation  Estimated Length of Stay: 3-5 days  Attendees: Patient: Did not attend 09/03/2020   Physician:  09/03/2020   Nursing:  09/03/2020   RN  Care Manager: 09/03/2020   Social Worker: Darletta Moll, LCSW 09/03/2020   Recreational Therapist:  09/03/2020   Other:  09/03/2020   Other:  09/03/2020   Other: 09/03/2020     Scribe for Treatment Team: Vassie Moselle, LCSW 09/03/2020 9:36 AM

## 2020-09-03 NOTE — BHH Group Notes (Signed)
[facility] Group Notes:  (Nursing/MHT/Case Management/Adjunct)  Date:  09/03/2020  Time:  6:44 PM  Type of Therapy:  Nurse Education  Participation Level:  Active  Participation Quality:  Appropriate and Attentive  Affect:  Blunted  Cognitive:  Alert and Appropriate  Insight:  Improving  Engagement in Group:  Engaged  Modes of Intervention:  Discussion  Summary of Progress/Problems: Discussed stigma in behavioral health.  Discussed ways to advocate for treatment needs.    Brooke Mcbride 09/03/2020, 6:44 PM

## 2020-09-03 NOTE — BHH Counselor (Signed)
CSW contacted [facility] and faxed over the patient's information.  CSW was informed that the facility has an available bed for Monday 09/06/20 and that the patient would need to call and complete an intake interview.  CSW provided the patient with the phone number and information to complete this intake.    CSW contacted [facility] and faxed over the patient's information.  CSW was informed that the facility has an available bed for Wednesday 09/08/20 and that the patient would need to call today and complete an intake interview.  CSW provided the patient with the phone number and information to complete this intake.

## 2020-09-04 NOTE — BHH Group Notes (Signed)
[facility] Group Notes:  (Nursing/MHT/Case Management/Adjunct)  Date:  09/04/2020  Time:  10:54 AM  Type of Therapy:   Goals and orientation group  Participation Level:  Active  Participation Quality:  Appropriate, Attentive, and Sharing  Affect:  Depressed and tearful  Cognitive:  Alert and Appropriate  Insight:  Appropriate, Good, and Improving  Engagement in Group:  Engaged and Supportive  Modes of Intervention:  Discussion and Education  Summary of Progress/Problems:  Vicky reported her goal for today is "review the paperwork for the 30 day program."  She slept well last night.  Barbette Or Prajna Vanderpool 09/04/2020, 10:54 AM

## 2020-09-04 NOTE — BHH Group Notes (Signed)
[facility] Group Notes:  (Nursing/MHT/Case Management/Adjunct)  Date:  09/04/2020  Time:  4:11 PM  Type of Therapy:  Nurse Education  Participation Level:  Did Not Attend  Participation Quality:    Affect:    Cognitive:    Insight:    Engagement in Group:    Modes of Intervention:  Discussion  Summary of Progress/Problems:  Did not attend despite staff invitation.  [place] 09/04/2020, 4:11 PM

## 2020-09-04 NOTE — Progress Notes (Signed)
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  24 hr events: no documented behavioral issues, no PRN medications given for agitation.  On interview, patient was pleasant and cooperative with interview. Stated that her sleep has improved, 6.75 hours. Patient denied SI/HI/AVH and paranoia. Stated that she feels safe here in the facility. Patient reported that she feels much improved, but now bored. Wishing that there was more reading materials. Patient reported sudden "image of places I hide my alcohol and my favorite bars", which makes her anxious. Though she denies EtOH craving, but admits to nicotine craving. Patient still endorsed anxiety, that is relieved with vistaril PRN. Patient then became teary when she shared her experience with security escorting her to [facility], stated that she embarrassed and upset. Patient chose [facility] facility for residential. Denied alcohol withdrawal and medication side effects.  Principal Problem: MDD (major depressive disorder), recurrent severe, without psychosis ([place]) Diagnosis: Principal Problem:   MDD (major depressive disorder), recurrent severe, without psychosis ([place]) Active Problems:   Tobacco use   Alcohol abuse  Total Time spent in direct patient care: 45 minutes  Past Psychiatric History: per H&P  Past Medical History: History reviewed. No pertinent past medical history. History reviewed. No pertinent surgical history. Family History: History reviewed. No pertinent family history. Family Psychiatric  History: per H&P Social History:  Social History   Substance and Sexual Activity  Alcohol Use Yes     Social History   Substance and Sexual Activity  Drug Use Not on file    Social History   Socioeconomic History  .  Marital status: Married    Spouse name: Not on file  . Number of children: Not on file  . Years of education: Not on file  . Highest education level: Not on file  Occupational History  . Not on file  Tobacco Use  . Smoking status: Every Day    Pack years: 0.00  . Smokeless tobacco: Never  . Tobacco comments:    Pt offered nicotine patch/gum, pt declined stating will not help.  Vaping Use  . Vaping Use: Never used  Substance and Sexual Activity  . Alcohol use: Yes  . Drug use: Not on file  . Sexual activity: Yes  Other Topics Concern  . Not on file  Social History Narrative  . Not on file   Social Determinants of Health   Financial Resource Strain: Not on file  Food Insecurity: Not on file  Transportation Needs: Not on file  Physical Activity: Not on file  Stress: Not on file  Social Connections: Not on file   Additional Social History:    Sleep: Good 6.75 hrs  Appetite:  Good  Current Medications: Current Facility-Administered Medications  Medication Dose Route Frequency Provider Last Rate Last Admin  . acetaminophen (TYLENOL) tablet 650 mg  650 mg Oral Q6H PRN Suella Broad, FNP      . alum & mag hydroxide-simeth (MAALOX/MYLANTA) 200-200-20 MG/5ML suspension 30 mL  30 mL Oral Q4H PRN Starkes-Perry, Gayland Curry, FNP      . cloNIDine (CATAPRES) tablet 0.1 mg  0.1 mg Oral Q8H PRN Singleton, Amy E, MD      . docusate sodium (COLACE) capsule 100 mg  100 mg Oral BID PRN Suella Broad, FNP      .  famotidine (PEPCID) tablet 20 mg  20 mg Oral Daily Suella Broad, FNP   20 mg at 09/04/20 5102  . gabapentin (NEURONTIN) capsule 300 mg  300 mg Oral TID Corky Sox, MD      . hydrOXYzine (ATARAX/VISTARIL) tablet 25 mg  25 mg Oral Q6H PRN Lindon Romp A, NP   25 mg at 09/04/20 1214  . loperamide (IMODIUM) capsule 2-4 mg  2-4 mg Oral PRN Lindon Romp A, NP      . LORazepam (ATIVAN) tablet 1 mg  1 mg Oral Q6H PRN Lindon Romp A, NP      . LORazepam  (ATIVAN) tablet 1 mg  1 mg Oral BID Rozetta Nunnery, NP       Followed by  . [START ON 09/06/2020] LORazepam (ATIVAN) tablet 1 mg  1 mg Oral Daily Lindon Romp A, NP      . magnesium hydroxide (MILK OF MAGNESIA) suspension 30 mL  30 mL Oral Daily PRN Starkes-Perry, Gayland Curry, FNP      . mirtazapine (REMERON) tablet 15 mg  15 mg Oral QHS Corky Sox, MD   15 mg at 09/03/20 2142  . nicotine polacrilex (NICORETTE) gum 2 mg  2 mg Oral PRN Harlow Asa, MD   2 mg at 09/04/20 0835  . ondansetron (ZOFRAN-ODT) disintegrating tablet 4 mg  4 mg Oral Q6H PRN Lindon Romp A, NP      . polyethylene glycol (MIRALAX / GLYCOLAX) packet 17 g  17 g Oral Daily PRN Suella Broad, FNP        Lab Results:  Results for orders placed or performed during the hospital encounter of 09/01/20 (from the past 48 hour(s))  Basic metabolic panel     Status: Abnormal   Collection Time: 09/03/20  6:31 AM  Result Value Ref Range   Sodium 136 135 - 145 mmol/L   Potassium 4.5 3.5 - 5.1 mmol/L   Chloride 103 98 - 111 mmol/L   CO2 24 22 - 32 mmol/L   Glucose, Bld 120 (H) 70 - 99 mg/dL    Comment: Glucose reference range applies only to samples taken after fasting for at least 8 hours.   BUN 11 6 - 20 mg/dL   Creatinine, Ser 0.88 0.44 - 1.00 mg/dL   Calcium 9.8 8.9 - 10.3 mg/dL   GFR, Estimated >60 >60 mL/min    Comment: (NOTE) Calculated using the CKD-EPI Creatinine Equation (2021)    Anion gap 9 5 - 15    Comment: Performed at [facility], [place] [address] [place], [place] [address]  Lipid panel     Status: Abnormal   Collection Time: 09/03/20  6:31 AM  Result Value Ref Range   Cholesterol 210 (H) 0 - 200 mg/dL   Triglycerides 78 <150 mg/dL   HDL 70 >40 mg/dL   Total CHOL/HDL Ratio 3.0 RATIO   VLDL 16 0 - 40 mg/dL   LDL Cholesterol 124 (H) 0 - 99 mg/dL    Comment:        Total Cholesterol/HDL:CHD Risk Coronary Heart Disease Risk Table                     Men   Women  1/2 Average  Risk   3.4   3.3  Average Risk       5.0   4.4  2 X Average Risk   9.6   7.1  3 X Average Risk  23.4  11.0        Use the calculated Patient Ratio above and the CHD Risk Table to determine the patient's CHD Risk.        ATP III CLASSIFICATION (LDL):  <100     mg/dL   Optimal  100-129  mg/dL   Near or Above                    Optimal  130-159  mg/dL   Borderline  160-189  mg/dL   High  >190     mg/dL   Very High Performed at [facility] [address], [place], [place] [address]   Hemoglobin A1c     Status: None   Collection Time: 09/03/20  6:31 AM  Result Value Ref Range   Hgb A1c MFr Bld 5.0 4.8 - 5.6 %    Comment: (NOTE) Pre diabetes:          5.7%-6.4%  Diabetes:              >6.4%  Glycemic control for   <7.0% adults with diabetes    Mean Plasma Glucose 96.8 mg/dL    Comment: Performed at [facility] [address], [place], [place] [address]    Blood Alcohol level:  Lab Results  Component Value Date   ETH 238 (H) 01/60/1093    Metabolic Disorder Labs: Lab Results  Component Value Date   HGBA1C 5.0 09/03/2020   MPG 96.8 09/03/2020   No results found for: PROLACTIN Lab Results  Component Value Date   CHOL 210 (H) 09/03/2020   TRIG 78 09/03/2020   HDL 70 09/03/2020   CHOLHDL 3.0 09/03/2020   VLDL 16 09/03/2020   LDLCALC 124 (H) 09/03/2020   LDLCALC 133 (H) 02/12/2020    Physical Findings: CIWA:  CIWA-Ar Total: 2 COWS:     Musculoskeletal: Strength & Muscle Tone: within normal limits Gait & Station: normal Patient leans: N/A  Psychiatric Specialty Exam: Physical Exam Constitutional:      Appearance: Normal appearance. She is normal weight.  HENT:     Head: Normocephalic and atraumatic.     Nose: Rhinorrhea present.  Eyes:     Extraocular Movements: Extraocular movements intact.     Conjunctiva/sclera: Conjunctivae normal.  Cardiovascular:     Rate and Rhythm: Regular rhythm.  Pulmonary:     Effort:  Pulmonary effort is normal.  Musculoskeletal:        General: Normal range of motion.     Cervical back: Normal range of motion.  Neurological:     General: No focal deficit present.     Mental Status: She is alert and oriented to person, place, and time.    Review of Systems  Constitutional:  Negative for chills and fatigue.  HENT:  Positive for rhinorrhea. Negative for congestion.   Eyes:  Negative for visual disturbance.  Respiratory:  Negative for chest tightness and shortness of breath.   Cardiovascular:  Negative for chest pain.  Gastrointestinal:  Negative for abdominal pain, constipation, diarrhea, nausea and vomiting.  Neurological:  Negative for dizziness, tremors, light-headedness and headaches.  Psychiatric/Behavioral:  Negative for agitation, confusion, decreased concentration, dysphoric mood, hallucinations, sleep disturbance and suicidal ideas. The patient is nervous/anxious. The patient is not hyperactive.    Blood pressure 132/78, pulse (!) 102, temperature 98.1 F (36.7 C), temperature source Oral, resp. rate 20, height 5' 5.1" (1.654 m), weight 70.8 kg, SpO2 98 %.Body mass index is 25.88 kg/m.  General Appearance: Well Groomed  Eye Contact:  Good  Speech:  Normal Rate  Volume:  Normal  Mood:  Euthymic  Affect:  Congruent  Thought Process:  Coherent, Goal Directed, and Linear  Orientation:  Full (Time, Place, and Person)  Thought Content:  Logical  Suicidal Thoughts:  No  Homicidal Thoughts:  No  Memory:  grossly intact per exam  Judgement:  Fair  Insight:  Fair  Psychomotor Activity:  Normal  Concentration:  good  Recall:  good  Fund of Knowledge:  good  Language:  Good  Akathisia:  No  Handed:  Right  AIMS (if indicated):   no AP meds  Assets:  Communication Skills Desire for Improvement Financial Resources/Insurance Housing Leisure Time Physical Health Social Support Talents/Skills Vocational/Educational  ADL's:  Intact  Cognition:  WNL   Sleep:  Number of Hours: 6.75   Blood pressure 132/78, pulse (!) 102, temperature 98.1 F (36.7 C), temperature source Oral, resp. rate 20, height 5' 5.1" (1.654 m), weight 70.8 kg, SpO2 98 %. Body mass index is 25.88 kg/m.  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 Aside from better sleep, patient seemed unchanged from previous day. Patient is still anxious, most likely from alcohol withdrawal. Patient's mood is mildly labile, would tear up during interview. She denied SI/HI/AVH and paranoia. Will continue to monitor.   Treatment Plan Summary: Daily contact with patient to assess and evaluate symptoms and progress in treatment and Medication management  SI in the context of severe AUD and depressive symptoms Ddx as above Precautions per unit and supportive psychotherapy Patient started on the following medicines with shared decision making: -Remeron 7.5 mg; to 15 mg 7/8 -Gabapentin 100 mg tid; to 200 mg tid 7/8 PM; to 300 mg tid 7/9 Pt will likely benefit from residential substance use treatment as well as OP therapy for PTSD -Will provide pt resources   Alcohol w/d On CIWA, last drink 7/3 Pt scored a 10 on CIWA on 7/6 with a BP of 180/98 Started on Ativan taper on admission to [facility] (1 mg qid->tid->bid->daily) CTM   Hypokalemia Severe hypoK to 2.2 during hospital admission Last K 4.2 on 7/6 -Rechecked with bmp on 7/8 AM: 4.5   GERD Continue Pepcid 20 mg daily  Hypertension Continue Clonidine 0.1mg  PO PRN for SBP >160 or DBP >110; hold for HR <60   Merrily Brittle PGY-1, Psychiatry

## 2020-09-04 NOTE — Progress Notes (Addendum)
Pt BP elevated 146/91.  Pt provided and encouraged to increase hydration.    09/04/20 2117  Psych Admission Type (Psych Patients Only)  Admission Status Involuntary  Psychosocial Assessment  Patient Complaints Anxiety  Eye Contact Fair  Facial Expression Animated;Anxious  Affect Anxious;Appropriate to circumstance  Speech Logical/coherent  Interaction Assertive  Motor Activity Slow  Appearance/Hygiene Unremarkable  Behavior Characteristics Cooperative;Appropriate to situation;Anxious  Mood Anxious;Pleasant  Thought Process  Coherency Concrete thinking  Content WDL  Delusions None reported or observed  Perception WDL  Hallucination None reported or observed  Judgment Poor  Confusion None  Danger to Self  Current suicidal ideation? Denies  Danger to Others  Danger to Others None reported or observed

## 2020-09-04 NOTE — Plan of Care (Signed)
  Problem: Coping: Goal: Coping ability will improve Outcome: Progressing   Problem: Medication: Goal: Compliance with prescribed medication regimen will improve Outcome: Progressing   Problem: Self-Concept: Goal: Level of anxiety will decrease Outcome: Progressing

## 2020-09-04 NOTE — BHH Group Notes (Signed)
[facility] Group Notes:  (Nursing/MHT/Case Management/Adjunct)   Date:  09/04/2020  Time:  7:39 PM   Type of Therapy:  Nurse Education   Participation Level:  Did Not Attend   Participation Quality:     Affect:     Cognitive:     Insight:     Engagement in Group:     Modes of Intervention:  Education   Summary of Progress/Problems:  Did not attend as she was showering.   Windell Moment 09/04/2020, 7:39 PM

## 2020-09-04 NOTE — Progress Notes (Addendum)
   09/04/20 1159  Psych Admission Type (Psych Patients Only)  Admission Status Involuntary  Psychosocial Assessment  Patient Complaints Anxiety  Eye Contact Fair  Facial Expression Animated;Anxious  Affect Anxious;Appropriate to circumstance  Speech Logical/coherent  Interaction Assertive  Motor Activity Slow  Appearance/Hygiene Unremarkable  Behavior Characteristics Cooperative;Appropriate to situation  Mood Anxious;Pleasant  Aggressive Behavior  Targets Family  Type of Behavior Verbal  Effect No apparent injury  Thought Process  Coherency Concrete thinking  Content WDL  Delusions None reported or observed  Perception WDL  Hallucination None reported or observed  Judgment Poor  Confusion None  Danger to Self  Current suicidal ideation? Denies  Danger to Others  Danger to Others None reported or observed   D. Pt is friendly during interactions- and has been visible in the milieu interacting well with peers and staff. Pt observed attending group led by SW. Per pt's self inventory, pt rated her depression, hopelessness and anxiety a 2/2/4, respectively. Pt currently denies SI/HI and AVH.  A. Labs and vitals monitored. Pt given and educated on medications. Pt supported emotionally and encouraged to express concerns and ask questions.   R. Pt remains safe with 15 minute checks. Will continue POC.

## 2020-09-04 NOTE — BHH Counselor (Addendum)
Clinical Social Work Note  At patient's request, CSW called [facility] [phone] to see if it will be okay for the patient to be brought there on Sunday 7/10 rather than waiting until Monday 7/11.  Awaiting call back.  Selmer Dominion, LCSW 09/04/2020, 3:10 PM   Call back received, and it has been confirmed patient can go to [facility] on Sunday 7/10 with approximately arrival time of 4:00pm.  She was informed and plans to call husband to pick her up sometime between 11:00am and 12:00pm.  CSW agreed to fax Discharge Summary as soon as available to fax # [phone].

## 2020-09-04 NOTE — BHH Group Notes (Signed)
LCSW Group Therapy Note 09/04/2020  11:15am-12:00pm  Type of Therapy and Topic:  Group Therapy: Anger and Commonalities  Participation Level:  Active   Description of Group: In this group, patients initially shared an "unknown" fact about themselves and CSW led a discussion about the ways in which we have things in common without realizing it.  Patient then identified a recent time they became angry and how this yet again showed a way in which they had something in common with other patients  We discussed possible unhealthy reactions to anger and possible healthy reactions.  We also discussed possible underlying emotions that lead to the anger.  Commonalities among group members were pointed out throughout the entirety of group.  Therapeutic Goals: Patients were asked to share something about themselves and learned that they often have things in common with other people without knowing this Patients will remember their last incident of anger and how they reacted Patients will be able to identify their reaction as healthy or unhealthy, and identify possible reactions that would have been the opposite Patients will learn that anger itself is a secondary emotion and will think about their primary emotion at the time of their last incident of anger  Summary of Patient Progress:  The patient shared that something interesting she could share about herself is 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  Being a fan of football was expressed as also present in several other patients, so the patient was able to recognize that she is not alone.  The patient also said that one frequent cause of anger is when her needs are ignored and she is disrespected, particularly by her daughter.  She said her coping skill used most recently was to overdose and she has now told her husband he must set boundaries with daughter because she cannot do so herself  Her conclusion by the end of group was that this was a healthy thing to do for  herself.  Therapeutic Modalities:   Cognitive Behavioral Therapy  Maretta Los, LCSW 09/04/2020  1:39 PM

## 2020-09-04 NOTE — BHH Counselor (Signed)
Clinical Social Work Note  CSW received phone call from [facility], [phone]) asking for patient to call intake worker [name] at [phone] to do intake interview in order to secure a spot in the program for Monday/Tuesday next week.  This information was given to patient who stated she has decided to go to [facility] instead.  CSW called [facility] to inform them.  Patient has given CSW Leave of Absence paperwork to be completed and signed by doctor.  She intends to be in treatment for 30 days.  Selmer Dominion, LCSW 09/04/2020, 1:33 PM

## 2020-09-05 MED ORDER — FAMOTIDINE 20 MG PO TABS: 20.0000 mg | ORAL_TABLET | Freq: Every day | ORAL | 0 refills | Status: DC

## 2020-09-05 MED ORDER — NICOTINE POLACRILEX 2 MG MT GUM: 2.0000 mg | CHEWING_GUM | OROMUCOSAL | 0 refills | Status: DC | PRN

## 2020-09-05 MED ORDER — HYDROXYZINE PAMOATE 25 MG PO CAPS: 25.0000 mg | ORAL_CAPSULE | Freq: Every day | ORAL | 0 refills | Status: DC | PRN

## 2020-09-05 MED ORDER — GABAPENTIN 300 MG PO CAPS: 300.0000 mg | ORAL_CAPSULE | Freq: Three times a day (TID) | ORAL | 0 refills | Status: DC

## 2020-09-05 MED ORDER — MIRTAZAPINE 15 MG PO TABS: 15.0000 mg | ORAL_TABLET | Freq: Every day | ORAL | 0 refills | Status: DC

## 2020-09-05 NOTE — Progress Notes (Signed)
  [facility] Adult Case Management Discharge Plan :  Will you be returning to the same living situation after discharge:  No.  Going to rehab, door to door.  The patient has expressed a commitment to stay for 30 days and, if recommended, to prolong that stay even it means losing her job At discharge, do you have transportation home?: Yes,  Husband will transport to rehab.  It has been explained that no side trips should be made on the way there. Do you have the ability to pay for your medications: Yes,  insurance, spousal support  Release of information consent forms completed and emailed to Medical Records, then turned in to Medical Records by CSW.   Patient to Follow up at:  [facility]. Go on 09/05/2020.   Why: You have been accepted to this program for long-term rehab, to be admitted on Sunday 7/10 at 4:00pm IN PERSON. Contact information: [place], [place] [address] Tel:  [phone] Fax:  [phone].        [facility] Follow up.   Specialty: Behavioral Health Why: Once you leave rehab, this is one possibility for follow-up Contact information: [place] [phone]                Next level of care provider has access to [place] and Suicide Prevention discussed: Yes,  with husband     Has patient been referred to the Quitline?: Patient is going to a program and cannot receive calls from the Quitline so no referral was made  Patient has been referred for addiction treatment: Yes  Additionally, patient's FMLA paperwork was faxed to [facility] at [place], [place] 09/05/2020, 9:40 AM

## 2020-09-05 NOTE — BHH Suicide Risk Assessment (Signed)
[facility] Discharge Suicide Risk Assessment   Principal Problem: MDD (major depressive disorder), recurrent severe, without psychosis ([place]) Discharge Diagnoses: Principal Problem:   MDD (major depressive disorder), recurrent severe, without psychosis ([place]) Active Problems:   Tobacco use   Alcohol abuse  Total Time Spent in Direct Patient Care:  I personally spent 30 minutes on the unit in direct patient care. The direct patient care time included face-to-face time with the patient, reviewing the patient's chart, communicating with other professionals, and coordinating care. Greater than 50% of this time was spent in counseling or coordinating care with the patient regarding goals of hospitalization, psycho-education, and discharge planning needs.  Subjective: Patient seen on rounds with Doctor, hospital. She states she has plans to have her husband and daughter pick her up and take her directly to [facility] in [place] today to start residential rehab. She denies current cravings for alcohol or acute signs of withdrawal. She denies SI, HI, AVH, paranoia, or delusions. She reports stable and improved mood. She is tolerating her medications without noted side-effects and voices no physical complaints. She reports stable sleep and appetite.   Musculoskeletal: Strength & Muscle Tone: within normal limits Gait & Station: normal, steady Patient leans: N/A Psychiatric Specialty Exam: Physical Exam Vitals reviewed.  HENT:     Head: Normocephalic.  Pulmonary:     Effort: Pulmonary effort is normal.  Neurological:     General: No focal deficit present.     Mental Status: She is alert.    Review of Systems  Respiratory:  Negative for shortness of breath.   Cardiovascular:  Negative for chest pain.  Gastrointestinal:  Negative for diarrhea, nausea and vomiting.   Blood pressure 123/89, pulse 81, temperature 98.4 F (36.9 C), temperature source Oral, resp. rate 16, height 5' 5.1" (1.654 m), weight 70.8  kg, SpO2 100 %.Body mass index is 25.88 kg/m.  General Appearance:  casually dressed, good hygiene  Eye Contact:  Good  Speech:  Clear and Coherent and Normal Rate  Volume:  Normal  Mood:  Euthymic  Affect:   moderate, stable, full  Thought Process:  Goal Directed and Linear  Orientation:  Full (Time, Place, and Person)  Thought Content:  Logical and no evidence of psychosis, delusions, paranoia  Suicidal Thoughts:  No  Homicidal Thoughts:  No  Memory:  Recent;   Good  Judgement:  Fair  Insight:  Fair  Psychomotor Activity:  Normal  Concentration:  Concentration: Good and Attention Span: Good  Recall:  Good  Fund of Knowledge:  Good  Language:  Good  Akathisia:  Negative  Assets:  Communication Skills Desire for Improvement Housing Physical Health Resilience Social Support Vocational/Educational  ADL's:  Intact  Cognition:  WNL  Sleep:  Number of Hours: 6.75   Mental Status Per Nursing Assessment::   On Admission:  suicide attempt prior to admission  Demographic Factors:  Caucasian  Loss Factors: Relationship stressors  Historical Factors: Prior suicide attempts, Family history of mental illness or substance abuse, and family h/o suicide, previous h/o trauma  Risk Reduction Factors:   Sense of responsibility to family, Employed, Living with another person, especially a relative, Positive social support, and Positive coping skills or problem solving skills  Continued Clinical Symptoms:  Depression:   Comorbid alcohol abuse/dependence Alcohol/Substance Abuse/Dependencies Previous Psychiatric Diagnoses and Treatments  Cognitive Features That Contribute To Risk:  None    Suicide Risk:  Mild: There are no identifiable plans, no associated intent, mild dysphoria and related symptoms,and identifiable protective  factors, including available and accessible social support.   [facility]. Go on 09/05/2020.   Why: You have been  accepted to this program for long-term rehab, to be admitted on Sunday 7/10 at 4:00pm IN PERSON. Contact information: [place], [place] [address] Tel:  [phone] Fax:  [phone].        [facility] Follow up.   Specialty: Behavioral Health Why: Once you leave rehab, this is one possibility for follow-up Contact information: [place] [phone]                Plan Of Care/Follow-up recommendations:  Activity:  as tolerated Diet:  heart healthy Other:  Patient advised to go directly to [facility] for intake today for residential rehab without fail. She was advised to comply with scheduled medications and to abstain from alcohol use after discharge.She is advised to see her primary care provider for management of her elevated cholesterol after discharge without fail. Smoking cessation encouraged.   Harlow Asa, MD, FAPA 09/05/2020, 9:49 AM

## 2020-09-05 NOTE — Progress Notes (Signed)
Pt discharged to lobby. Husband present to transport pt to facility in [employer]. Pt was stable and appreciative at that time. All papers and prescriptions were given and valuables returned. Verbal understanding expressed. Denies SI/HI and A/VH. Pt given opportunity to express concerns and ask questions.

## 2020-09-05 NOTE — Progress Notes (Signed)
Adult Psychoeducational Group Note  Date:  09/05/2020 Time:  9:54 AM  Group Topic/Focus:  Goals Group:   The focus of this group is to help patients establish daily goals to achieve during treatment and discuss how the patient can incorporate goal setting into their daily lives to aide in recovery.  Participation Level:  Active  Participation Quality:  Appropriate  Affect:  Appropriate  Cognitive:  Appropriate  Insight: Appropriate  Engagement in Group:  Engaged  Modes of Intervention:  Discussion  Additional Comments:  Pt did attend group and participated in discussion.  She is going to a 30 day rehab for alcohol. She states that she hopes that she can quit but still has thoughts of drinking.  Ariyonna Twichell R Isam Unrein 09/05/2020, 9:54 AM

## 2020-09-05 NOTE — Progress Notes (Addendum)
   09/05/20 0900  Psych Admission Type (Psych Patients Only)  Admission Status Involuntary  Psychosocial Assessment  Patient Complaints Anxiety  Eye Contact Fair  Facial Expression Animated;Anxious  Affect Anxious;Appropriate to circumstance  Speech Logical/coherent  Interaction Assertive  Motor Activity Slow  Appearance/Hygiene Unremarkable  Behavior Characteristics Cooperative;Appropriate to situation  Mood Anxious;Pleasant  Aggressive Behavior  Targets Family  Type of Behavior Verbal  Effect No apparent injury  Thought Process  Coherency Concrete thinking  Content WDL  Delusions None reported or observed  Perception WDL  Hallucination None reported or observed  Judgment Poor  Confusion None  Danger to Self  Current suicidal ideation? Denies  Danger to Others  Danger to Others None reported or observed   D. Pt presents with an anxious affect- brightens upon approach- pt has been visible in the milieu interacting well with peers this am- pt reports that she is looking forward to discharge today.Pt currently denies withdrawal symptoms, pain, SI/HI and AVH  A. Labs and vitals monitored. Pt given and educated on medications. Pt supported emotionally and encouraged to express concerns and ask questions.   R. Pt remains safe with 15 minute checks. Will continue POC.

## 2020-09-05 NOTE — Progress Notes (Deleted)
Pt did not attend group. 

## 2020-09-05 NOTE — BHH Group Notes (Signed)
LCSW Group Therapy Note  Type of Therapy and Topic:  Group Therapy - Healthy vs Unhealthy Coping Skills  Participation Level:  Active  Description of Group The focus of this group was to determine what unhealthy coping techniques typically are used by group members and what healthy coping techniques would be helpful in coping with various problems. Patients were guided in becoming aware of the differences between healthy and unhealthy coping techniques. Patients were asked to identify 2-3 healthy coping skills they would like to learn to use more effectively.  Therapeutic Goals 1. Patients learned that coping is what human beings do all day long to deal with various situations in their lives 2. Patients defined and discussed healthy vs unhealthy coping techniques 3. Patients identified their preferred coping techniques and identified whether these were healthy or unhealthy 4. Patients determined 2-3 healthy coping skills they would like to become more familiar with and use more often. 5. Patients provided support and ideas to each other   Summary of Patient Progress:   Pt was appropriate, active, and attentive during group discussion.   Therapeutic Modalities Cognitive Behavioral Therapy Motivational Interviewing  Brooke Mcbride 09/05/2020  10:59 AM
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
## 2020-10-28 DIAGNOSIS — F419 Anxiety disorder, unspecified: Secondary | ICD-10-CM | POA: Insufficient documentation

## 2020-11-16 ENCOUNTER — Other Ambulatory Visit: Payer: Self-pay | Admitting: Internal Medicine

## 2021-08-11 ENCOUNTER — Telehealth: Payer: Self-pay | Admitting: Internal Medicine

## 2021-08-11 NOTE — Telephone Encounter (Signed)
Patient should be rescheduled with PCP. I am unable to accept TOC at this time.

## 2021-08-11 NOTE — Telephone Encounter (Signed)
I had spoke with PT today regarding getting an appointment rescheduled. PT had also brought up she was requiring a physical but was wanting to do this with Dr.Crawford on the same day as her upcoming appointment. I had let  her know she would need to get a TOC set up to switch over to [place]. I had informed PT that a TOC appointment would be a little out from now and that I would need approval from Dr.Plotnikov as well as Dr.Crawford before proceeding. I will call PT back once I have an answer back from both providers to set up TOC.

## 2021-08-12 NOTE — Telephone Encounter (Signed)
I am okay with TOC to another physician, however, Dr. Sharlet [place]ina was not able to accept. Thanks

## 2021-08-17 ENCOUNTER — Ambulatory Visit: Payer: Self-pay | Admitting: Internal Medicine

## 2021-08-22 ENCOUNTER — Ambulatory Visit: Payer: Self-pay | Admitting: Internal Medicine

## 2021-08-29 ENCOUNTER — Ambulatory Visit (INDEPENDENT_AMBULATORY_CARE_PROVIDER_SITE_OTHER): Payer: 59 | Admitting: Internal Medicine

## 2021-08-29 ENCOUNTER — Encounter: Payer: Self-pay | Admitting: Internal Medicine

## 2021-08-29 VITALS — BP 122/60 | HR 72 | Temp 97.8°F | Ht 65.1 in | Wt 141.0 lb

## 2021-08-29 DIAGNOSIS — F1011 Alcohol abuse, in remission: Secondary | ICD-10-CM | POA: Diagnosis not present

## 2021-08-29 DIAGNOSIS — Z0000 Encounter for general adult medical examination without abnormal findings: Secondary | ICD-10-CM | POA: Diagnosis not present

## 2021-08-29 DIAGNOSIS — E785 Hyperlipidemia, unspecified: Secondary | ICD-10-CM

## 2021-08-29 DIAGNOSIS — E034 Atrophy of thyroid (acquired): Secondary | ICD-10-CM

## 2021-08-29 DIAGNOSIS — F3289 Other specified depressive episodes: Secondary | ICD-10-CM | POA: Diagnosis not present

## 2021-08-29 DIAGNOSIS — F17210 Nicotine dependence, cigarettes, uncomplicated: Secondary | ICD-10-CM

## 2021-08-29 DIAGNOSIS — L0292 Furuncle, unspecified: Secondary | ICD-10-CM | POA: Diagnosis not present

## 2021-08-29 DIAGNOSIS — Z136 Encounter for screening for cardiovascular disorders: Secondary | ICD-10-CM

## 2021-08-29 DIAGNOSIS — Z1211 Encounter for screening for malignant neoplasm of colon: Secondary | ICD-10-CM

## 2021-08-29 DIAGNOSIS — F1010 Alcohol abuse, uncomplicated: Secondary | ICD-10-CM

## 2021-08-29 LAB — COMPREHENSIVE METABOLIC PANEL
ALT: 21 U/L (ref 0–35)
AST: 20 U/L (ref 0–37)
Albumin: 4.9 g/dL (ref 3.5–5.2)
Alkaline Phosphatase: 48 U/L (ref 39–117)
BUN: 10 mg/dL (ref 6–23)
CO2: 28 mEq/L (ref 19–32)
Calcium: 10 mg/dL (ref 8.4–10.5)
Chloride: 102 mEq/L (ref 96–112)
Creatinine, Ser: 0.95 mg/dL (ref 0.40–1.20)
GFR: 69.26 mL/min (ref >60.00)
Glucose, Bld: 99 mg/dL (ref 70–99)
Potassium: 4.1 mEq/L (ref 3.5–5.1)
Sodium: 138 mEq/L (ref 135–145)
Total Bilirubin: 0.4 mg/dL (ref 0.2–1.2)
Total Protein: 7.4 g/dL (ref 6.0–8.3)

## 2021-08-29 LAB — VITAMIN B12: Vitamin B-12: 450 pg/mL (ref 211–911)

## 2021-08-29 LAB — URINALYSIS
Bilirubin Urine: NEGATIVE
Hgb urine dipstick: NEGATIVE
Ketones, ur: NEGATIVE
Leukocytes,Ua: NEGATIVE
Nitrite: NEGATIVE
Specific Gravity, Urine: <=1.005 — AB (ref 1.000–1.030)
Total Protein, Urine: NEGATIVE
Urine Glucose: NEGATIVE
Urobilinogen, UA: 0.2 (ref 0.0–1.0)
pH: 6.5 (ref 5.0–8.0)

## 2021-08-29 LAB — LIPID PANEL
Cholesterol: 288 mg/dL — ABNORMAL HIGH (ref 0–200)
HDL: 55.9 mg/dL (ref >39.00)
LDL Cholesterol: 206 mg/dL — ABNORMAL HIGH (ref 0–99)
NonHDL: 231.64
Total CHOL/HDL Ratio: 5
Triglycerides: 130 mg/dL (ref 0.0–149.0)
VLDL: 26 mg/dL (ref 0.0–40.0)

## 2021-08-29 LAB — CBC WITH DIFFERENTIAL/PLATELET
Basophils Absolute: 0.1 10*3/uL (ref 0.0–0.1)
Basophils Relative: 1 % (ref 0.0–3.0)
Eosinophils Absolute: 0.3 10*3/uL (ref 0.0–0.7)
Eosinophils Relative: 3.6 % (ref 0.0–5.0)
HCT: 43.8 % (ref 36.0–46.0)
Hemoglobin: 14.6 g/dL (ref 12.0–15.0)
Lymphocytes Relative: 27.5 % (ref 12.0–46.0)
Lymphs Abs: 2.1 10*3/uL (ref 0.7–4.0)
MCHC: 33.4 g/dL (ref 30.0–36.0)
MCV: 95 fl (ref 78.0–100.0)
Monocytes Absolute: 0.5 10*3/uL (ref 0.1–1.0)
Monocytes Relative: 6.7 % (ref 3.0–12.0)
Neutro Abs: 4.6 10*3/uL (ref 1.4–7.7)
Neutrophils Relative %: 61.2 % (ref 43.0–77.0)
Platelets: 323 10*3/uL (ref 150.0–400.0)
RBC: 4.61 Mil/uL (ref 3.87–5.11)
RDW: 13.8 % (ref 11.5–15.5)
WBC: 7.5 10*3/uL (ref 4.0–10.5)

## 2021-08-29 LAB — TSH: TSH: 2.43 u[IU]/mL (ref 0.35–5.50)

## 2021-08-29 LAB — T4, FREE: Free T4: 0.77 ng/dL (ref 0.60–1.60)

## 2021-08-29 LAB — VITAMIN D 25 HYDROXY (VIT D DEFICIENCY, FRACTURES): VITD: 52.17 ng/mL (ref 30.00–100.00)

## 2021-08-29 MED ORDER — TRAZODONE HCL 150 MG PO TABS: 150.0000 mg | ORAL_TABLET | Freq: Every day | ORAL | 1 refills | Status: DC

## 2021-08-29 MED ORDER — VITAMIN D3 50 MCG (2000 UT) PO CAPS: 2000.0000 [IU] | ORAL_CAPSULE | Freq: Every day | ORAL | 3 refills | Status: AC

## 2021-08-29 NOTE — Assessment & Plan Note (Signed)
Vaginal area. No h/o HSV. Was given abx by her GYN in the past

## 2021-08-29 NOTE — Assessment & Plan Note (Signed)
Dry since 07/2021

## 2021-08-29 NOTE — Assessment & Plan Note (Signed)
Smoking more. Lost wt due to smoking more

## 2021-08-29 NOTE — Assessment & Plan Note (Addendum)

## 2021-08-29 NOTE — Progress Notes (Addendum)
Subjective:  Patient ID: Brooke Mcbride, female    DOB: 08-01-1970  Age: 51 y.o. MRN: [id number]  CC: No chief complaint on file.   HPI Brooke Mcbride presents for a well exam Dry x 12 months. On Trazodone only now. Not seeing a psychiatrist or psychologist. Not hungry - lost 20 lbs.  Outpatient Medications Prior to Visit  Medication Sig Dispense Refill   lamoTRIgine (LAMICTAL) 150 MG tablet TAKE 1 TABLET BY MOUTH EVERY DAY 90 tablet 1   traZODone (DESYREL) 50 MG tablet Take 50 mg by mouth at bedtime.     famotidine (PEPCID) 20 MG tablet Take 1 tablet (20 mg total) by mouth daily. 30 tablet 0   gabapentin (NEURONTIN) 300 MG capsule Take 1 capsule (300 mg total) by mouth 3 (three) times daily. 90 capsule 0   hydrOXYzine (VISTARIL) 25 MG capsule Take 1 capsule (25 mg total) by mouth daily as needed. 30 capsule 0   mirtazapine (REMERON) 15 MG tablet Take 1 tablet (15 mg total) by mouth at bedtime. 30 tablet 0   nicotine polacrilex (NICORETTE) 2 MG gum Take 1 each (2 mg total) by mouth as needed for smoking cessation. 100 tablet 0   No facility-administered medications prior to visit.    ROS: Review of Systems  Constitutional:  Positive for unexpected weight change. Negative for activity change, appetite change, chills and fatigue.  HENT:  Negative for congestion, mouth sores and sinus pressure.   Eyes:  Negative for visual disturbance.  Respiratory:  Negative for cough and chest tightness.   Gastrointestinal:  Negative for abdominal pain and nausea.  Genitourinary:  Negative for difficulty urinating, frequency and vaginal pain.  Musculoskeletal:  Negative for back pain and gait problem.  Skin:  Negative for pallor and rash.  Neurological:  Negative for dizziness, tremors, weakness, numbness and headaches.  Psychiatric/Behavioral:  Positive for decreased concentration and dysphoric mood. Negative for behavioral problems, confusion, sleep disturbance and suicidal  ideas. The patient is nervous/anxious.     Objective:  BP 122/60 (BP Location: Left Arm, Patient Position: Sitting, Cuff Size: Normal)   Pulse 72   Temp 97.8 F (36.6 C) (Oral)   Ht 5' 5.1" (1.654 m)   Wt 141 lb (64 kg)   SpO2 99%   BMI 23.39 kg/m   BP Readings from Last 3 Encounters:  08/29/21 122/60  09/01/20 (!) 153/93  03/04/20 138/86    Wt Readings from Last 3 Encounters:  08/29/21 141 lb (64 kg)  09/01/20 162 lb (73.5 kg)  03/04/20 156 lb (70.8 kg)    Physical Exam Constitutional:      General: She is not in acute distress.    Appearance: Normal appearance. She is well-developed.  HENT:     Head: Normocephalic.     Right Ear: External ear normal.     Left Ear: External ear normal.     Nose: Nose normal.  Eyes:     General:        Right eye: No discharge.        Left eye: No discharge.     Conjunctiva/sclera: Conjunctivae normal.     Pupils: Pupils are equal, round, and reactive to light.  Neck:     Thyroid: No thyromegaly.     Vascular: No JVD.     Trachea: No tracheal deviation.  Cardiovascular:     Rate and Rhythm: Normal rate and regular rhythm.     Heart sounds: Normal heart sounds.  Pulmonary:  Effort: No respiratory distress.     Breath sounds: No stridor. No wheezing.  Abdominal:     General: Bowel sounds are normal. There is no distension.     Palpations: Abdomen is soft. There is no mass.     Tenderness: There is no abdominal tenderness. There is no guarding or rebound.  Musculoskeletal:        General: No tenderness.     Cervical back: Normal range of motion and neck supple. No rigidity.  Lymphadenopathy:     Cervical: No cervical adenopathy.  Skin:    Findings: No erythema or rash.  Neurological:     Cranial Nerves: No cranial nerve deficit.     Motor: No abnormal muscle tone.     Coordination: Coordination normal.     Deep Tendon Reflexes: Reflexes normal.  Psychiatric:        Behavior: Behavior normal.        Thought Content:  Thought content normal.        Judgment: Judgment normal.     Lab Results  Component Value Date   WBC 9.5 09/01/2020   HGB 12.6 09/01/2020   HCT 37.4 09/01/2020   PLT 190 09/01/2020   GLUCOSE 120 (H) 09/03/2020   CHOL 210 (H) 09/03/2020   TRIG 78 09/03/2020   HDL 70 09/03/2020   LDLDIRECT 143.4 11/05/2012   LDLCALC 124 (H) 09/03/2020   ALT 33 08/30/2020   AST 31 08/30/2020   NA 136 09/03/2020   K 4.5 09/03/2020   CL 103 09/03/2020   CREATININE 0.88 09/03/2020   BUN 11 09/03/2020   CO2 24 09/03/2020   TSH 2.71 02/12/2020   INR 1.0 08/30/2020   HGBA1C 5.0 09/03/2020    No results found.  Assessment & Plan:   Problem List Items Addressed This Visit     Alcohol abuse    Dry since 07/2021      Boil    Vaginal area. No h/o HSV. Was given abx by her GYN in the past      Cigarette smoker    Smoking more. Lost wt due to smoking more      Depression    Better On Trazodone only now. Not seeing a psychiatrist or psychologist. Dry since 2022      Relevant Medications   traZODone (DESYREL) 150 MG tablet   Hypothyroidism    Not on Rx 8 years Check TSH      Relevant Orders   Vitamin B12   T4, free   VITAMIN D 25 Hydroxy (Vit-D Deficiency, Fractures)   Well adult exam - Primary     We discussed age appropriate health related issues, including available/recomended screening tests and vaccinations. Labs were ordered to be later reviewed . All questions were answered. We discussed one or more of the following - seat belt use, use of sunscreen/sun exposure exercise, fall risk reduction, second hand smoke exposure, firearm use and storage, seat belt use, a need for adhering to healthy diet and exercise. Labs were ordered.  All questions were answered. Cologuard ordered         Relevant Orders   TSH   Urinalysis   CBC with Differential/Platelet   Lipid panel   Comprehensive metabolic panel   Vitamin J88   T4, free   VITAMIN D 25 Hydroxy (Vit-D Deficiency,  Fractures)   Other Visit Diagnoses     Colon cancer screening       Relevant Orders   Cologuard  Meds ordered this encounter  Medications   Cholecalciferol (VITAMIN D3) 50 MCG (2000 UT) capsule    Sig: Take 1 capsule (2,000 Units total) by mouth daily.    Dispense:  100 capsule    Refill:  3   traZODone (DESYREL) 150 MG tablet    Sig: Take 1 tablet (150 mg total) by mouth at bedtime.    Dispense:  90 tablet    Refill:  1      Follow-up: Return in about 6 months (around 03/01/2022) for a follow-up visit.  Walker Kehr, MD

## 2021-08-29 NOTE — Assessment & Plan Note (Addendum)
Better On Trazodone only now. Not seeing a psychiatrist or psychologist. Dry since 2022

## 2021-08-29 NOTE — Assessment & Plan Note (Addendum)
Not on Rx 8 years Check TSH

## 2021-08-31 DIAGNOSIS — E785 Hyperlipidemia, unspecified: Secondary | ICD-10-CM | POA: Insufficient documentation

## 2021-08-31 NOTE — Assessment & Plan Note (Signed)
Repeat in 6 mo Consider coronary calcium CT

## 2021-08-31 NOTE — Addendum Note (Signed)
Addended by: Cassandria Anger on: 08/31/2021 07:25 AM   Modules accepted: Orders

## 2021-11-03 ENCOUNTER — Other Ambulatory Visit (HOSPITAL_COMMUNITY): Payer: Self-pay | Admitting: Obstetrics and Gynecology

## 2021-11-03 ENCOUNTER — Other Ambulatory Visit: Payer: Self-pay | Admitting: Obstetrics and Gynecology

## 2021-11-03 DIAGNOSIS — F17210 Nicotine dependence, cigarettes, uncomplicated: Secondary | ICD-10-CM

## 2021-11-03 DIAGNOSIS — F17200 Nicotine dependence, unspecified, uncomplicated: Secondary | ICD-10-CM

## 2021-12-19 IMAGING — DX DG CHEST 1V PORT
1 series · 1 of 1 positions shown · non-contrast
Comparison: None.

CLINICAL DATA: Overdose.  Post intubation.

EXAM:
PORTABLE CHEST 1 VIEW

[chest ap]
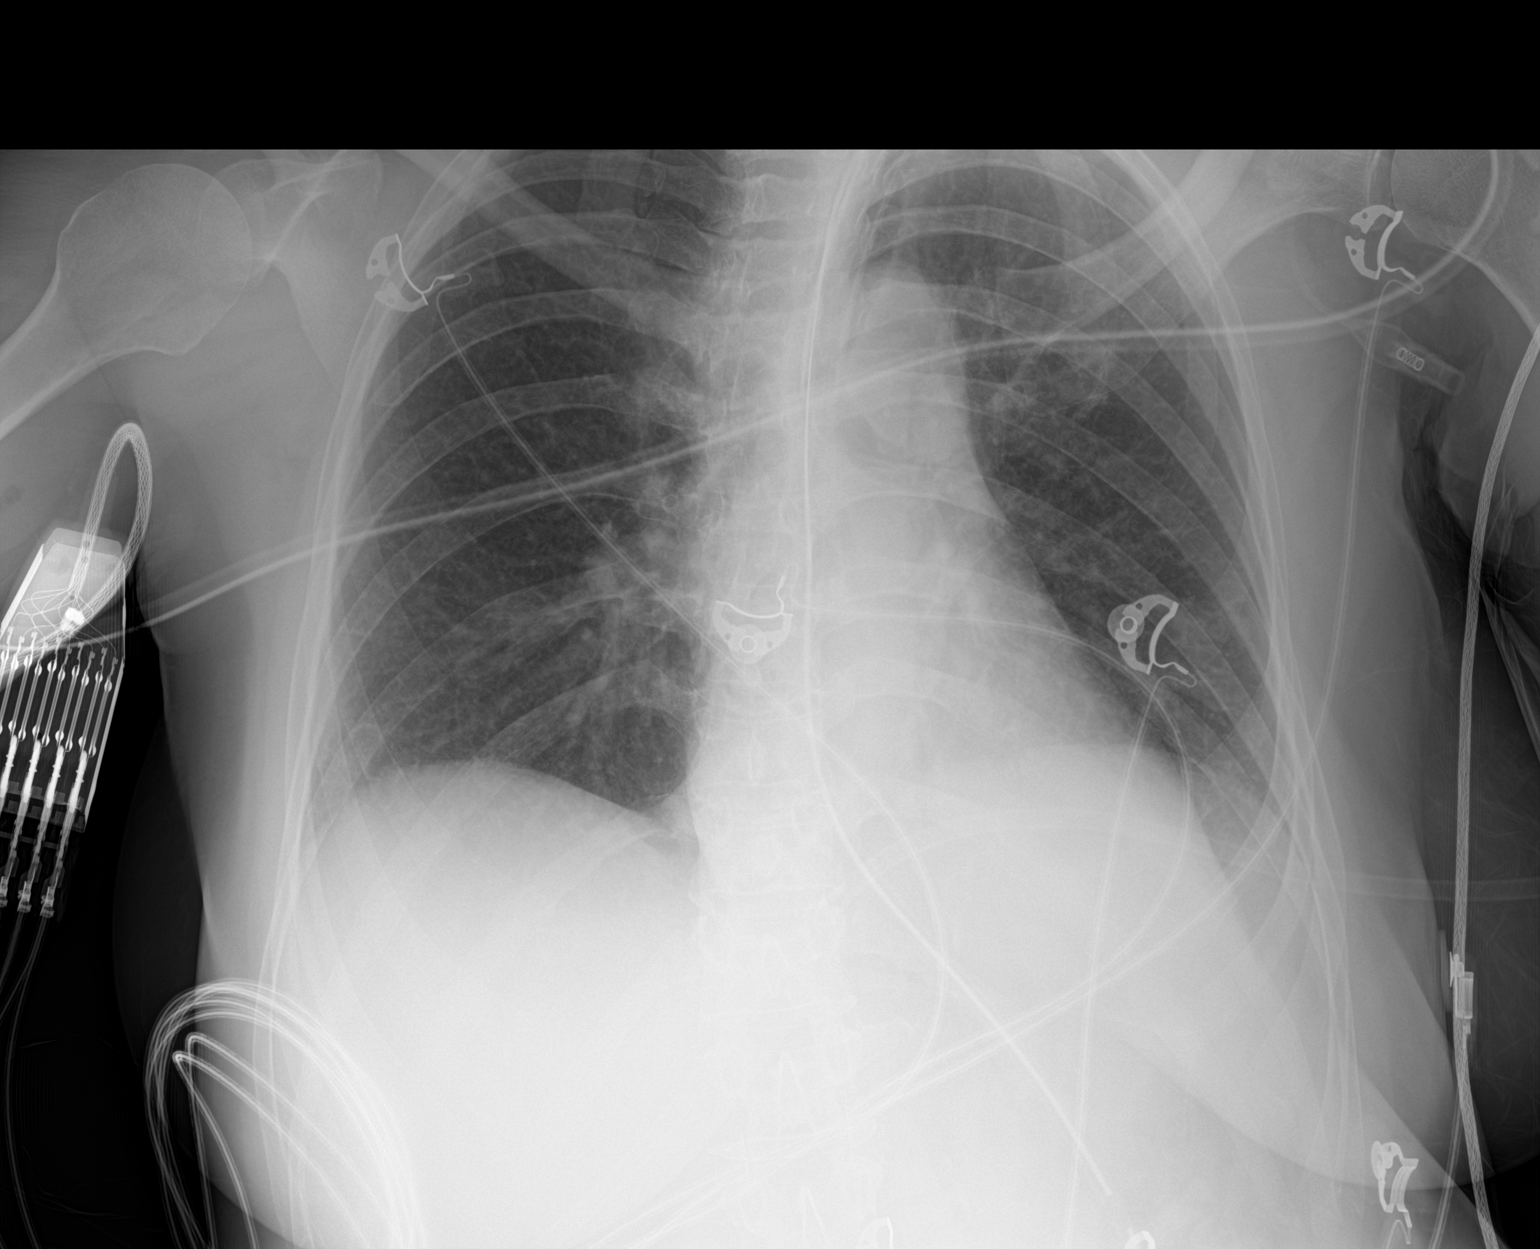

[1 of 1 positions shown; findings below may reference images not displayed]

FINDINGS: An endotracheal tube has been placed with tip measuring about 1.1 cm
above the carina. Enteric tube was placed. Tip is off the field of
view but below the left hemidiaphragm.

Shallow inspiration. Heart size and pulmonary vascularity are
normal. Lungs are clear. No pleural effusions. No pneumothorax.
Mediastinal contours appear intact.
IMPRESSION: Appliances appear in satisfactory position.  Lungs are clear.

## 2022-05-04 ENCOUNTER — Other Ambulatory Visit: Payer: Self-pay | Admitting: Internal Medicine

## 2022-09-22 ENCOUNTER — Telehealth: Payer: 59 | Admitting: Family Medicine

## 2023-01-11 ENCOUNTER — Other Ambulatory Visit: Payer: Self-pay | Admitting: Internal Medicine

## 2023-09-05 ENCOUNTER — Other Ambulatory Visit: Payer: Self-pay | Admitting: Internal Medicine

## 2023-09-30 ENCOUNTER — Other Ambulatory Visit: Payer: Self-pay | Admitting: Internal Medicine

## 2023-10-26 ENCOUNTER — Other Ambulatory Visit: Payer: Self-pay | Admitting: Internal Medicine

## 2024-02-18 ENCOUNTER — Other Ambulatory Visit: Payer: Self-pay | Admitting: Internal Medicine
